# Patient Record
Sex: Female | Born: 1955 | Race: White | Hispanic: No | Marital: Married | Smoking: Former smoker
Health system: Southern US, Community
[De-identification: ages and names within clinical notes are randomized; demographics above are authoritative.]

## PROBLEM LIST (undated history)

## (undated) ENCOUNTER — Emergency Department (HOSPITAL_COMMUNITY): Admission: EM | Payer: Self-pay | Source: Home / Self Care

## (undated) DIAGNOSIS — M1990 Unspecified osteoarthritis, unspecified site: Secondary | ICD-10-CM

## (undated) DIAGNOSIS — K5909 Other constipation: Secondary | ICD-10-CM

## (undated) DIAGNOSIS — Z860101 Personal history of adenomatous and serrated colon polyps: Secondary | ICD-10-CM

## (undated) DIAGNOSIS — Z86010 Personal history of colonic polyps: Secondary | ICD-10-CM

## (undated) DIAGNOSIS — T8859XA Other complications of anesthesia, initial encounter: Secondary | ICD-10-CM

## (undated) DIAGNOSIS — Z8489 Family history of other specified conditions: Secondary | ICD-10-CM

## (undated) DIAGNOSIS — K219 Gastro-esophageal reflux disease without esophagitis: Secondary | ICD-10-CM

## (undated) DIAGNOSIS — N3946 Mixed incontinence: Secondary | ICD-10-CM

## (undated) DIAGNOSIS — T7840XA Allergy, unspecified, initial encounter: Secondary | ICD-10-CM

## (undated) DIAGNOSIS — Z789 Other specified health status: Secondary | ICD-10-CM

## (undated) DIAGNOSIS — Z923 Personal history of irradiation: Secondary | ICD-10-CM

## (undated) DIAGNOSIS — K649 Unspecified hemorrhoids: Secondary | ICD-10-CM

## (undated) DIAGNOSIS — M542 Cervicalgia: Secondary | ICD-10-CM

## (undated) DIAGNOSIS — N939 Abnormal uterine and vaginal bleeding, unspecified: Secondary | ICD-10-CM

## (undated) DIAGNOSIS — R931 Abnormal findings on diagnostic imaging of heart and coronary circulation: Secondary | ICD-10-CM

## (undated) DIAGNOSIS — Z973 Presence of spectacles and contact lenses: Secondary | ICD-10-CM

## (undated) DIAGNOSIS — C50919 Malignant neoplasm of unspecified site of unspecified female breast: Secondary | ICD-10-CM

## (undated) DIAGNOSIS — N3281 Overactive bladder: Secondary | ICD-10-CM

## (undated) DIAGNOSIS — K602 Anal fissure, unspecified: Secondary | ICD-10-CM

## (undated) DIAGNOSIS — Z9889 Other specified postprocedural states: Secondary | ICD-10-CM

## (undated) DIAGNOSIS — T884XXA Failed or difficult intubation, initial encounter: Secondary | ICD-10-CM

## (undated) DIAGNOSIS — Z87442 Personal history of urinary calculi: Secondary | ICD-10-CM

## (undated) DIAGNOSIS — E042 Nontoxic multinodular goiter: Secondary | ICD-10-CM

## (undated) DIAGNOSIS — L309 Dermatitis, unspecified: Secondary | ICD-10-CM

## (undated) HISTORY — DX: Unspecified hemorrhoids: K64.9

## (undated) HISTORY — DX: Unspecified osteoarthritis, unspecified site: M19.90

## (undated) HISTORY — PX: OTHER SURGICAL HISTORY: SHX169

## (undated) HISTORY — PX: BREAST LUMPECTOMY: SHX2

## (undated) HISTORY — PX: DILATION AND CURETTAGE OF UTERUS: SHX78

## (undated) HISTORY — DX: Allergy, unspecified, initial encounter: T78.40XA

## (undated) HISTORY — PX: TUBAL LIGATION: SHX77

## (undated) HISTORY — DX: Anal fissure, unspecified: K60.2

## (undated) HISTORY — DX: Nontoxic multinodular goiter: E04.2

## (undated) HISTORY — DX: Malignant neoplasm of unspecified site of unspecified female breast: C50.919

---

## 2000-10-08 ENCOUNTER — Other Ambulatory Visit: Admission: RE | Admit: 2000-10-08 | Discharge: 2000-10-08 | Payer: Self-pay | Admitting: Obstetrics and Gynecology

## 2000-10-09 ENCOUNTER — Encounter: Payer: Self-pay | Admitting: Obstetrics and Gynecology

## 2000-10-09 ENCOUNTER — Other Ambulatory Visit: Admission: RE | Admit: 2000-10-09 | Discharge: 2000-10-09 | Payer: Self-pay | Admitting: Obstetrics and Gynecology

## 2000-10-09 ENCOUNTER — Ambulatory Visit (HOSPITAL_COMMUNITY): Admission: RE | Admit: 2000-10-09 | Discharge: 2000-10-09 | Payer: Self-pay | Admitting: Obstetrics and Gynecology

## 2005-09-13 ENCOUNTER — Emergency Department (HOSPITAL_COMMUNITY): Admission: EM | Admit: 2005-09-13 | Discharge: 2005-09-13 | Payer: Self-pay | Admitting: Emergency Medicine

## 2010-12-06 ENCOUNTER — Emergency Department (HOSPITAL_COMMUNITY)
Admission: EM | Admit: 2010-12-06 | Discharge: 2010-12-06 | Payer: Self-pay | Source: Home / Self Care | Admitting: Emergency Medicine

## 2011-04-02 ENCOUNTER — Ambulatory Visit: Payer: Self-pay | Admitting: Gynecology

## 2011-05-21 ENCOUNTER — Other Ambulatory Visit (HOSPITAL_COMMUNITY): Payer: Self-pay | Admitting: Obstetrics & Gynecology

## 2011-05-21 DIAGNOSIS — Z1231 Encounter for screening mammogram for malignant neoplasm of breast: Secondary | ICD-10-CM

## 2011-05-29 ENCOUNTER — Ambulatory Visit (HOSPITAL_COMMUNITY)
Admission: RE | Admit: 2011-05-29 | Discharge: 2011-05-29 | Disposition: A | Discharge status: Home / Self Care | Payer: Self-pay | Source: Ambulatory Visit | Attending: Obstetrics & Gynecology | Admitting: Obstetrics & Gynecology

## 2011-05-29 DIAGNOSIS — Z1231 Encounter for screening mammogram for malignant neoplasm of breast: Secondary | ICD-10-CM

## 2012-08-04 ENCOUNTER — Other Ambulatory Visit (HOSPITAL_COMMUNITY): Payer: Self-pay | Admitting: Obstetrics & Gynecology

## 2012-08-04 DIAGNOSIS — Z1231 Encounter for screening mammogram for malignant neoplasm of breast: Secondary | ICD-10-CM

## 2012-08-13 ENCOUNTER — Ambulatory Visit (HOSPITAL_COMMUNITY)
Admission: RE | Admit: 2012-08-13 | Discharge: 2012-08-13 | Disposition: A | Discharge status: Home / Self Care | Payer: Self-pay | Source: Ambulatory Visit | Attending: Obstetrics & Gynecology | Admitting: Obstetrics & Gynecology

## 2012-08-13 DIAGNOSIS — Z1231 Encounter for screening mammogram for malignant neoplasm of breast: Secondary | ICD-10-CM

## 2013-03-13 ENCOUNTER — Ambulatory Visit: Payer: Self-pay | Admitting: Family Medicine

## 2013-03-13 VITALS — BP 108/68 | HR 95 | Temp 97.9°F | Resp 16 | Ht 61.0 in | Wt 181.2 lb

## 2013-03-13 DIAGNOSIS — J329 Chronic sinusitis, unspecified: Secondary | ICD-10-CM

## 2013-03-13 MED ORDER — BENZONATATE 200 MG PO CAPS: 200.0000 mg | ORAL_CAPSULE | Freq: Three times a day (TID) | ORAL | Status: DC | PRN

## 2013-03-13 MED ORDER — METHYLPREDNISOLONE ACETATE 80 MG/ML IJ SUSP
80.0000 mg | Freq: Once | INTRAMUSCULAR | Status: AC
  Administered 2013-03-13: 80 mg via INTRAMUSCULAR

## 2013-03-13 MED ORDER — AZITHROMYCIN 250 MG PO TABS: ORAL_TABLET | ORAL | Status: DC

## 2013-03-13 NOTE — Patient Instructions (Addendum)

## 2013-03-13 NOTE — Progress Notes (Signed)
Subjective:    Patient ID: Melanie Roth, female    DOB: 01-24-1956, 57 y.o.   MRN: [id number] Chief Complaint  Patient presents with  . Cough    yellow mucus     HPI  Sxs started about a wk ago - thought it was just pollen allergy - but she always develops sinus infection which goes untreated always goes into bronchitis.  Taking zyrtec w/o relief. States that for relief she always goes to the [facility] on [address] but they were to busy so she came here. States she has to have an antibiotic shot and antibiotic pills - it is the only thing that works for her. Hasn't slept in 3d due to nasal congestion and post-nasal drip with cough.  No fever, few chills, + diffuse sinus pressure, no ear pain, no jaw or gum pain, no sore throat.  Cough productive of brownish-yellow mucous which is how she new this was more than allergies.    Past Medical History  Diagnosis Date  . Allergy   . Arthritis    No current outpatient prescriptions on file prior to visit.   No current facility-administered medications on file prior to visit.   Not on File   Review of Systems  Constitutional: Positive for chills and fatigue. Negative for fever, diaphoresis, activity change and appetite change.  HENT: Positive for congestion, rhinorrhea, sneezing, postnasal drip and sinus pressure. Negative for ear pain, nosebleeds, sore throat, drooling, mouth sores, trouble swallowing, neck pain, neck stiffness, dental problem, voice change and ear discharge.   Eyes: Negative for discharge and itching.  Respiratory: Positive for cough. Negative for shortness of breath.   Cardiovascular: Negative for chest pain.  Gastrointestinal: Negative for nausea, vomiting and abdominal pain.  Skin: Negative for rash.  Neurological: Positive for headaches. Negative for dizziness and syncope.  Hematological: Negative for adenopathy.  Psychiatric/Behavioral: Positive for sleep disturbance.      BP 108/68  Pulse 95  Temp(Src) 97.9 F  (36.6 C) (Oral)  Resp 16  Ht 5\' 1"  (1.549 m)  Wt 181 lb 3.2 oz (82.192 kg)  BMI 34.26 kg/m2  SpO2 98% Objective:   Physical Exam  Constitutional: She is oriented to person, place, and time. She appears well-developed and well-nourished. She does not appear ill. No distress.  HENT:  Head: Normocephalic and atraumatic.  Right Ear: External ear and ear canal normal. Tympanic membrane is retracted. A middle ear effusion is present.  Left Ear: External ear and ear canal normal. Tympanic membrane is retracted. A middle ear effusion is present.  Nose: Mucosal edema and rhinorrhea present. Right sinus exhibits maxillary sinus tenderness. Left sinus exhibits maxillary sinus tenderness.  Mouth/Throat: Uvula is midline and mucous membranes are normal. Posterior oropharyngeal erythema present. No oropharyngeal exudate, posterior oropharyngeal edema or tonsillar abscesses.  Eyes: Conjunctivae are normal. Right eye exhibits no discharge. Left eye exhibits no discharge. No scleral icterus.  Neck: Normal range of motion. Neck supple.  Cardiovascular: Normal rate, regular rhythm, normal heart sounds and intact distal pulses.   Pulmonary/Chest: Effort normal and breath sounds normal.  Lymphadenopathy:       Head (right side): No submandibular, no preauricular and no posterior auricular adenopathy present.       Head (left side): No submandibular, no preauricular and no posterior auricular adenopathy present.    She has no cervical adenopathy.       Right: No supraclavicular adenopathy present.       Left: No supraclavicular adenopathy present.  Neurological: She is alert and oriented to person, place, and time.  Skin: Skin is warm and dry. She is not diaphoretic. No erythema.  Psychiatric: She has a normal mood and affect. Her behavior is normal.      Assessment & Plan:  Sinus infection - Plan: methylPREDNISolone acetate (DEPO-MEDROL) injection 80 mg - start sinus rinse/netti pot to help w/  congestion.  Meds ordered this encounter  Medications  . cetirizine (ZYRTEC) 10 MG tablet    Sig: Take 10 mg by mouth daily.  . methylPREDNISolone acetate (DEPO-MEDROL) injection 80 mg    Sig:                                 . benzonatate (TESSALON) 200 MG capsule    Sig: Take 1 capsule (200 mg total) by mouth 3 (three) times daily as needed for cough.    Dispense:  30 capsule    Refill:  0  . azithromycin (ZITHROMAX) 250 MG tablet    Sig: Take 2 tabs PO x 1 dose, then 1 tab PO QD x 4 days    Dispense:  6 tablet    Refill:  0

## 2013-03-14 ENCOUNTER — Telehealth: Payer: Self-pay

## 2013-03-14 NOTE — Telephone Encounter (Signed)
Spoke with patient and she states her head feels better but everything has gone to her chest. She is coughing/productive/brownish and feels like something is constantly ion her throat. i asked her if her cough was worse then yesterday but she said she didn't have a cough when she came in. Cough was noted in OV. Please advise?

## 2013-03-14 NOTE — Telephone Encounter (Signed)
Pt is calling because she was seen yesterday for sinus infection. Per pt she is not better and now everything is in her chest. Pt is requesting a "penicillian" Please call pt to advise

## 2013-03-14 NOTE — Telephone Encounter (Signed)
She was c/o cough during OV - that's why she was rx the tessalon pearles - I wouldn't have given these to her if she wasn't having a cough.  The zpack that she was rx works better for bronchitis or walking pneumonia than a penicillin so she should continue the current antibiotic.  It will last in her system for about 12d even after a 5d course. If she develops more f/c, shortness or breath, wheezing, chest pain, or purulent sputum, then she needs to RTC for further eval to ensure she does not have a pneumonia.

## 2013-03-14 NOTE — Telephone Encounter (Signed)
Patient notified and voiced understanding.

## 2013-03-15 ENCOUNTER — Telehealth: Payer: Self-pay | Admitting: *Deleted

## 2013-03-15 ENCOUNTER — Telehealth: Payer: Self-pay

## 2013-03-15 NOTE — Telephone Encounter (Signed)
PATIENT STATES SHE CALLED AT 3AM AND A MAN ANSWERED HER CALL. STATES THAT SHE WOULD LIKE PENICILLIN INSTEAD OF A Z-PACK TO HELP HER COUGH. PATIENT IS ADAMANT THAT SHE HAS BRONCHITIS. PLEASE CALL [phone]

## 2013-03-15 NOTE — Telephone Encounter (Signed)
Spoke with patient and she states she is not getting better and just needs a different abx to get better. Doesn't know why she is getting worse if abx is supposed to make it better. she was up coughing and its getting worse.

## 2013-03-15 NOTE — Telephone Encounter (Signed)
Fine. I think pt was self pay for her visit. Clearly it would be best to reassess her as the medicine I prescribed her works best for for bronchitis and walking pneumonia and if she actually has a bacterial bronchitis/pneumonia she needs to be reassessed as even a "penicillin" wouldn't work for that.  But since pt is self-pay, since she really wants to try a "penicillin", fine to call in amoxicillin 1000mg  bid. Disp 40 (10d supply of the 500mg  tabs), no refills.  For her symptoms, it often takes several days for the antibiotic to penetrate and start working so she cannot expect immediate relief. If she is now getting better, do not start the amoxicillin as she will become resistant to her "penicillins" with yearly overuse. If she is getting worse, she has to be evaluated.

## 2013-03-15 NOTE — Telephone Encounter (Signed)
This is a message from the answering service. "Patient would like another medication." Asked if it was refill, but they could not specify that's the only message they got.  Best [phone]

## 2013-03-15 NOTE — Telephone Encounter (Signed)
I have advised her to come back in for this. She is taking the meds and using mucinex.

## 2013-03-15 NOTE — Telephone Encounter (Signed)
Pt has called several times and stated that she would like her antibiotic to be changed because she is no better.  She states that now her symptoms has moved down from her head to her chest.  I advised her that she is on the correct medication and that this should help her. Spoke with PA  Herbert Seta and she looked at the phone messages and advised [place] to tell the pt that she should come in.  She stated that she does not understand why we want her to come in so we can take her money and she will get an attorney, called the director of [employer], and all the news stations because we will not change her medication or try to help her.  She went on about that Dr. Clelia Croft does not know what she is doing and she does not want to talk to anyone but a Dr. I advised her that all our providers were in room but I will get management/Dr. Katrinka Blazing to call her back.  It really seemed I couldn't really get anywhere with her.  Her questions had gotten a little out of my scope.    Please help her  Thanks Apolonio Schneiders

## 2013-03-16 MED ORDER — AMOXICILLIN 500 MG PO CAPS: 1000.0000 mg | ORAL_CAPSULE | Freq: Two times a day (BID) | ORAL | Status: DC

## 2013-03-16 NOTE — Telephone Encounter (Signed)
Left message for her to advise the Amox was sent in for her, if she needs anything further she will call back, if not improved, she should return to clinic.

## 2013-03-16 NOTE — Addendum Note (Signed)
Addended byCaffie Damme on: 03/16/2013 11:15 AM   Modules accepted: Orders

## 2013-03-16 NOTE — Telephone Encounter (Signed)
Called and spoke with patient. Patients was very upset because she feels that her concerns were not addressed. She states that she was never called regarding her concerns and that no one had compassion for what she was going through. States that she borrowed money from a friend to go to the emergency department last night and get an injection. States that she will not be paying a bill and not to send her one. I advised her that if she did not pay in full she would be receiving a bill. She states that she will not pay it and that she would get an attorney first. I advised her that it was her right if that is what she would like to do. Patient calmed somewhat by the end of the conversation.

## 2013-03-16 NOTE — Telephone Encounter (Signed)
Pt called again , left a voicemail at the billing office after hours, awaiting a call back.

## 2013-08-01 ENCOUNTER — Other Ambulatory Visit (HOSPITAL_COMMUNITY): Payer: Self-pay | Admitting: Obstetrics & Gynecology

## 2013-08-01 DIAGNOSIS — Z1231 Encounter for screening mammogram for malignant neoplasm of breast: Secondary | ICD-10-CM

## 2013-08-16 ENCOUNTER — Ambulatory Visit (HOSPITAL_COMMUNITY)
Admission: RE | Admit: 2013-08-16 | Discharge: 2013-08-16 | Disposition: A | Discharge status: Home / Self Care | Payer: Self-pay | Source: Ambulatory Visit | Attending: Obstetrics & Gynecology | Admitting: Obstetrics & Gynecology

## 2013-08-16 DIAGNOSIS — Z1231 Encounter for screening mammogram for malignant neoplasm of breast: Secondary | ICD-10-CM

## 2014-08-21 ENCOUNTER — Emergency Department (HOSPITAL_COMMUNITY)
Admission: EM | Admit: 2014-08-21 | Discharge: 2014-08-22 | Disposition: A | Discharge status: Home / Self Care | Payer: Self-pay | Attending: Emergency Medicine | Admitting: Emergency Medicine

## 2014-08-21 ENCOUNTER — Encounter (HOSPITAL_COMMUNITY): Payer: Self-pay | Admitting: Emergency Medicine

## 2014-08-21 DIAGNOSIS — M542 Cervicalgia: Secondary | ICD-10-CM

## 2014-08-21 DIAGNOSIS — Z79899 Other long term (current) drug therapy: Secondary | ICD-10-CM | POA: Insufficient documentation

## 2014-08-21 DIAGNOSIS — G8929 Other chronic pain: Secondary | ICD-10-CM

## 2014-08-21 DIAGNOSIS — Z8739 Personal history of other diseases of the musculoskeletal system and connective tissue: Secondary | ICD-10-CM | POA: Insufficient documentation

## 2014-08-21 DIAGNOSIS — R112 Nausea with vomiting, unspecified: Secondary | ICD-10-CM

## 2014-08-21 DIAGNOSIS — Z87891 Personal history of nicotine dependence: Secondary | ICD-10-CM | POA: Insufficient documentation

## 2014-08-21 HISTORY — DX: Cervicalgia: M54.2

## 2014-08-21 LAB — URINALYSIS, ROUTINE W REFLEX MICROSCOPIC
Bilirubin Urine: NEGATIVE
Glucose, UA: NEGATIVE mg/dL
Ketones, ur: 40 mg/dL — AB
LEUKOCYTES UA: NEGATIVE
NITRITE: NEGATIVE
PH: 6.5 (ref 5.0–8.0)
Protein, ur: NEGATIVE mg/dL
Specific Gravity, Urine: 1.021 (ref 1.005–1.030)
Urobilinogen, UA: 0.2 mg/dL (ref 0.0–1.0)

## 2014-08-21 LAB — CBC WITH DIFFERENTIAL/PLATELET
Basophils Absolute: 0 10*3/uL (ref 0.0–0.1)
Basophils Relative: 0 % (ref 0–1)
EOS PCT: 0 % (ref 0–5)
Eosinophils Absolute: 0 10*3/uL (ref 0.0–0.7)
HCT: 40.2 % (ref 36.0–46.0)
Hemoglobin: 13.2 g/dL (ref 12.0–15.0)
LYMPHS PCT: 15 % (ref 12–46)
Lymphs Abs: 1.2 10*3/uL (ref 0.7–4.0)
MCH: 27.8 pg (ref 26.0–34.0)
MCHC: 32.8 g/dL (ref 30.0–36.0)
MCV: 84.6 fL (ref 78.0–100.0)
Monocytes Absolute: 0.2 10*3/uL (ref 0.1–1.0)
Monocytes Relative: 2 % — ABNORMAL LOW (ref 3–12)
NEUTROS PCT: 83 % — AB (ref 43–77)
Neutro Abs: 6.8 10*3/uL (ref 1.7–7.7)
PLATELETS: 270 10*3/uL (ref 150–400)
RBC: 4.75 MIL/uL (ref 3.87–5.11)
RDW: 13 % (ref 11.5–15.5)
WBC: 8.2 10*3/uL (ref 4.0–10.5)

## 2014-08-21 LAB — URINE MICROSCOPIC-ADD ON

## 2014-08-21 LAB — COMPREHENSIVE METABOLIC PANEL
ALT: 16 U/L (ref 0–35)
AST: 17 U/L (ref 0–37)
Albumin: 3.8 g/dL (ref 3.5–5.2)
Alkaline Phosphatase: 112 U/L (ref 39–117)
Anion gap: 16 — ABNORMAL HIGH (ref 5–15)
BILIRUBIN TOTAL: 0.5 mg/dL (ref 0.3–1.2)
BUN: 12 mg/dL (ref 6–23)
CALCIUM: 9.6 mg/dL (ref 8.4–10.5)
CHLORIDE: 97 meq/L (ref 96–112)
CO2: 23 mEq/L (ref 19–32)
CREATININE: 0.44 mg/dL — AB (ref 0.50–1.10)
GLUCOSE: 119 mg/dL — AB (ref 70–99)
Potassium: 3.9 mEq/L (ref 3.7–5.3)
Sodium: 136 mEq/L — ABNORMAL LOW (ref 137–147)
Total Protein: 8.5 g/dL — ABNORMAL HIGH (ref 6.0–8.3)

## 2014-08-21 LAB — LIPASE, BLOOD: Lipase: 15 U/L (ref 11–59)

## 2014-08-21 MED ORDER — DIAZEPAM 5 MG/ML IJ SOLN
2.5000 mg | Freq: Once | INTRAMUSCULAR | Status: AC
  Administered 2014-08-21: 2.5 mg via INTRAVENOUS
  Filled 2014-08-21: qty 2

## 2014-08-21 MED ORDER — SODIUM CHLORIDE 0.9 % IV SOLN
1000.0000 mL | Freq: Once | INTRAVENOUS | Status: AC
  Administered 2014-08-22: 1000 mL via INTRAVENOUS

## 2014-08-21 MED ORDER — SODIUM CHLORIDE 0.9 % IV SOLN
1000.0000 mL | INTRAVENOUS | Status: DC
  Administered 2014-08-22: 1000 mL via INTRAVENOUS

## 2014-08-21 MED ORDER — ONDANSETRON HCL 4 MG/2ML IJ SOLN
4.0000 mg | Freq: Once | INTRAMUSCULAR | Status: AC
  Administered 2014-08-21: 4 mg via INTRAVENOUS
  Filled 2014-08-21: qty 2

## 2014-08-21 NOTE — ED Notes (Signed)
N/v since this early am. C/o posterior neck pain that is chronic.

## 2014-08-21 NOTE — ED Notes (Addendum)
Pt c/o chronic neck pain; reports pain relief typically with a chiropractor or massage therapy. Pt woke up this morning c/o NV, denies abdominal pain; reports having massage on neck this am without relief. States the nausea and vomiting worsens pain. Pt also refused to get in a gown; states "It's too cold and my shoulders will lock up and freeze. My neck pain will get worse." Pt given two warm blankets.

## 2014-08-22 ENCOUNTER — Emergency Department (HOSPITAL_COMMUNITY): Payer: Self-pay

## 2014-08-22 MED ORDER — ONDANSETRON 8 MG PO TBDP: 8.0000 mg | ORAL_TABLET | Freq: Three times a day (TID) | ORAL | Status: DC | PRN

## 2014-08-22 MED ORDER — PROMETHAZINE HCL 25 MG PO TABS: 25.0000 mg | ORAL_TABLET | Freq: Four times a day (QID) | ORAL | Status: DC | PRN

## 2014-08-22 MED ORDER — METHOCARBAMOL 750 MG PO TABS: 750.0000 mg | ORAL_TABLET | Freq: Four times a day (QID) | ORAL | Status: DC | PRN

## 2014-08-22 MED ORDER — PROMETHAZINE HCL 25 MG RE SUPP: 25.0000 mg | Freq: Four times a day (QID) | RECTAL | Status: DC | PRN

## 2014-08-22 MED ORDER — DIPHENHYDRAMINE HCL 50 MG/ML IJ SOLN
25.0000 mg | Freq: Once | INTRAMUSCULAR | Status: AC
  Administered 2014-08-22: 25 mg via INTRAVENOUS
  Filled 2014-08-22: qty 1

## 2014-08-22 MED ORDER — METOCLOPRAMIDE HCL 5 MG/ML IJ SOLN
10.0000 mg | Freq: Once | INTRAMUSCULAR | Status: AC
  Administered 2014-08-22: 10 mg via INTRAVENOUS
  Filled 2014-08-22: qty 2

## 2014-08-22 MED ORDER — FENTANYL CITRATE 0.05 MG/ML IJ SOLN
100.0000 ug | Freq: Once | INTRAMUSCULAR | Status: AC
  Administered 2014-08-22: 100 ug via INTRAVENOUS
  Filled 2014-08-22: qty 2

## 2014-08-22 NOTE — Discharge Instructions (Signed)
Musculoskeletal Pain Musculoskeletal pain is muscle and boney aches and pains. These pains can occur in any part of the body. Your caregiver may treat you without knowing the cause of the pain. They may treat you if blood or urine tests, X-rays, and other tests were normal.  CAUSES There is often not a definite cause or reason for these pains. These pains may be caused by a type of germ (virus). The discomfort may also come from overuse. Overuse includes working out too hard when your body is not fit. Boney aches also come from weather changes. Bone is sensitive to atmospheric pressure changes. HOME CARE INSTRUCTIONS   Ask when your test results will be ready. Make sure you get your test results.  Only take over-the-counter or prescription medicines for pain, discomfort, or fever as directed by your caregiver. If you were given medications for your condition, do not drive, operate machinery or power tools, or sign legal documents for 24 hours. Do not drink alcohol. Do not take sleeping pills or other medications that may interfere with treatment.  Continue all activities unless the activities cause more pain. When the pain lessens, slowly resume normal activities. Gradually increase the intensity and duration of the activities or exercise.  During periods of severe pain, bed rest may be helpful. Lay or sit in any position that is comfortable.  Putting ice on the injured area.  Put ice in a bag.  Place a towel between your skin and the bag.  Leave the ice on for 15 to 20 minutes, 3 to 4 times a day.  Follow up with your caregiver for continued problems and no reason can be found for the pain. If the pain becomes worse or does not go away, it may be necessary to repeat tests or do additional testing. Your caregiver may need to look further for a possible cause. SEEK IMMEDIATE MEDICAL CARE IF:  You have pain that is getting worse and is not relieved by medications.  You develop chest pain  that is associated with shortness or breath, sweating, feeling sick to your stomach (nauseous), or throw up (vomit).  Your pain becomes localized to the abdomen.  You develop any new symptoms that seem different or that concern you. MAKE SURE YOU:   Understand these instructions.  Will watch your condition.  Will get help right away if you are not doing well or get worse. Document Released: 10/27/2005 Document Revised: 01/19/2012 Document Reviewed: 07/01/2013 [facility] Patient Information 2015 [place], [place]. This information is not intended to replace advice given to you by your health care provider. Make sure you discuss any questions you have with your health care provider.  Nausea and Vomiting Nausea is a sick feeling that often comes before throwing up (vomiting). Vomiting is a reflex where stomach contents come out of your mouth. Vomiting can cause severe loss of body fluids (dehydration). Children and elderly adults can become dehydrated quickly, especially if they also have diarrhea. Nausea and vomiting are symptoms of a condition or disease. It is important to find the cause of your symptoms. CAUSES   Direct irritation of the stomach lining. This irritation can result from increased acid production (gastroesophageal reflux disease), infection, food poisoning, taking certain medicines (such as nonsteroidal anti-inflammatory drugs), alcohol use, or tobacco use.  Signals from the brain.These signals could be caused by a headache, heat exposure, an inner ear disturbance, increased pressure in the brain from injury, infection, a tumor, or a concussion, pain, emotional stimulus, or metabolic  problems.  An obstruction in the gastrointestinal tract (bowel obstruction).  Illnesses such as diabetes, hepatitis, gallbladder problems, appendicitis, kidney problems, cancer, sepsis, atypical symptoms of a heart attack, or eating disorders.  Medical treatments such as chemotherapy and  radiation.  Receiving medicine that makes you sleep (general anesthetic) during surgery. DIAGNOSIS Your caregiver may ask for tests to be done if the problems do not improve after a few days. Tests may also be done if symptoms are severe or if the reason for the nausea and vomiting is not clear. Tests may include:  Urine tests.  Blood tests.  Stool tests.  Cultures (to look for evidence of infection).  X-rays or other imaging studies. Test results can help your caregiver make decisions about treatment or the need for additional tests. TREATMENT You need to stay well hydrated. Drink frequently but in small amounts.You may wish to drink water, sports drinks, clear broth, or eat frozen ice pops or gelatin dessert to help stay hydrated.When you eat, eating slowly may help prevent nausea.There are also some antinausea medicines that may help prevent nausea. HOME CARE INSTRUCTIONS   Take all medicine as directed by your caregiver.  If you do not have an appetite, do not force yourself to eat. However, you must continue to drink fluids.  If you have an appetite, eat a normal diet unless your caregiver tells you differently.  Eat a variety of complex carbohydrates (rice, wheat, potatoes, bread), lean meats, yogurt, fruits, and vegetables.  Avoid high-fat foods because they are more difficult to digest.  Drink enough water and fluids to keep your urine clear or pale yellow.  If you are dehydrated, ask your caregiver for specific rehydration instructions. Signs of dehydration may include:  Severe thirst.  Dry lips and mouth.  Dizziness.  Dark urine.  Decreasing urine frequency and amount.  Confusion.  Rapid breathing or pulse. SEEK IMMEDIATE MEDICAL CARE IF:   You have blood or brown flecks (like coffee grounds) in your vomit.  You have black or bloody stools.  You have a severe headache or stiff neck.  You are confused.  You have severe abdominal pain.  You have  chest pain or trouble breathing.  You do not urinate at least once every 8 hours.  You develop cold or clammy skin.  You continue to vomit for longer than 24 to 48 hours.  You have a fever. MAKE SURE YOU:   Understand these instructions.  Will watch your condition.  Will get help right away if you are not doing well or get worse. Document Released: 10/27/2005 Document Revised: 01/19/2012 Document Reviewed: 03/26/2011 [facility] Patient Information 2015 [place], [place]. This information is not intended to replace advice given to you by your health care provider. Make sure you discuss any questions you have with your health care provider.    Emergency Department Resource Guide 1) Find a Doctor and Pay Out of Pocket Although you won't have to find out who is covered by your insurance plan, it is a good idea to ask around and get recommendations. You will then need to call the office and see if the doctor you have chosen will accept you as a new patient and what types of options they offer for patients who are self-pay. Some doctors offer discounts or will set up payment plans for their patients who do not have insurance, but you will need to ask so you aren't surprised when you get to your appointment.  2) Contact Your Local Health Department  Not all health departments have doctors that can see patients for sick visits, but many do, so it is worth a call to see if yours does. If you don't know where your local health department is, you can check in your phone book. The CDC also has a tool to help you locate your state's health department, and many state websites also have listings of all of their local health departments.  3) Find a [facility] If your illness is not likely to be very severe or complicated, you may want to try a walk in clinic. These are popping up all over the country in pharmacies, drugstores, and shopping centers. They're usually staffed by nurse practitioners or  physician assistants that have been trained to treat common illnesses and complaints. They're usually fairly quick and inexpensive. However, if you have serious medical issues or chronic medical problems, these are probably not your best option.  No Primary Care Doctor: - Call Health Connect at  [phone] - they can help you locate a primary care doctor that  accepts your insurance, provides certain services, etc. - Physician Referral Service- [phone]  Chronic Pain Problems: Organization         Address  Phone   Notes  [facility]  [phone] Patients need to be referred by their primary care doctor.   Medication Assistance: Organization         Address  Phone   Notes  [facility] Medication [facility] [address] [place], [place] [address] [phone] --Must be a resident of [facility] -- Must have NO insurance coverage whatsoever (no Medicaid/ Medicare, etc.) -- The pt. MUST have a primary care doctor that directs their care regularly and follows them in the community   MedAssist  [phone]   [employer]  [phone]    Agencies that provide inexpensive medical care: Organization         Address  Phone   Notes  [place]  [phone]   [facility]    [phone]   [facility] [place], [place] [address] [phone]   [place] [address] [place] [phone]   Planned Parenthood    [phone]   [facility]    [phone]   [place] and [place] [address], [place] Phone:  [phone], Fax:  [phone] Hours of Operation:  9 am - 6 pm, M-F.  Also accepts Medicaid/Medicare and self-pay.  [facility] for [place], [address], [place] Phone: [phone], Fax: [phone]. Hours of Operation:  8:30 am - 5:30 pm, M-F.   Also accepts Medicaid and self-pay.  [facility] [place] [address] [place] Phone: [phone]   [place], [place], [place] [phone], Ext. [phone] Mondays & Thursdays: 7-9 AM.  First 15 patients are seen on a first come, first serve basis.    [place] Providers:  Organization         Address  Phone   Notes  [facility] [address], [place] [phone] Also accepts self-pay patients.  [place], [place]  [phone]   [place], [place], [place] [phone]  [place] [address] [place] [phone]   Lucianne Lei [address], [place]   [phone] Only accepts [place] Access [place] patients after they have their name applied to their card.   Self-Pay (no insurance) in [facility]:  Organization         Address  Phone   Notes  Sickle Cell Patients, [facility] [place] [phone]   [facility] [place] [phone]   [facility] [place]  [place], [place],  [phone]   [facility]/Dr. Osei-Bonsu  [address] [place] or [address], [place] [phone] Phone number for both [place] and [place] locations is the same.  Urgent Medical and [facility] [address], [place] [phone]   [facility] [address], [place] or [address] [phone]   [facility] [address] [place] [phone], phone; [phone], fax Sees patients 1st and 3rd Saturday of every month.  Must not qualify for public or private insurance (i.e. Medicaid, Medicare, [place] Health Choice, Veterans'  Benefits)  Household income should be no more than 200% of the poverty level The clinic cannot treat you if you are pregnant or think you are pregnant  Sexually transmitted diseases are not treated at the clinic.    Dental Care: Organization         Address  Phone  Notes  [facility] Department of [facility] [place] [phone] Accepts children up to age 33 who are enrolled in [place] or [place]; pregnant women with a Medicaid card; and children who have applied for Medicaid or [place] Health Choice, but were declined, whose parents can pay a reduced fee at time of service.  [facility] Department of [facility]  [address], [place] [phone] Accepts children up to age 38 who are enrolled in [place] or [place]; pregnant women with a Medicaid card; and children who have applied for Medicaid or [place] Health Choice, but were declined, whose parents can pay a reduced fee at time of service.  [place] Adult Dental Access PROGRAM  [place] [phone] Patients are seen by appointment only. Walk-ins are not accepted. [place] will see patients 96 years of age and older. Monday - Tuesday (8am-5pm) Most Wednesdays (8:30-5pm) $30 per visit, cash only  [facility] Adult Dental Access PROGRAM  [address], [facility] [phone] Patients are seen by appointment only. Walk-ins are not accepted. [place] will see patients 39 years of age and older. One Wednesday Evening (Monthly: Volunteer Based).  $30 per visit, cash only  [place]  [phone] for adults; Children under age 2, call Graduate Pediatric Dentistry at [phone]. Children aged 52-14, please call [phone] to request a pediatric application.  Dental services are provided in all areas of dental care including fillings, crowns and bridges, complete and partial  dentures, implants, gum treatment, root canals, and extractions. Preventive care is also provided. Treatment is provided to both adults and children. Patients are selected via a lottery and there is often a waiting list.   [facility] [address] [place]  [phone] [email or web address]  [facility] [address] [place], [place] [phone], Ext. [phone] Second and Fourth Thursday of each month, opens at 6:30 AM; Clinic ends at 9 AM.  Patients are seen on a first-come first-served basis, and a limited number are seen during each clinic.   [facility]  [address] Hillard Danker [place], [place] [phone]   Eligibility Requirements You must have lived in [place], [place], or [place] counties for at least the last three months.   You cannot be eligible for state or federal sponsored [employer], including [employer], [place], or [employer].   You generally cannot be eligible for healthcare insurance through your employer.    How to apply: Eligibility screenings are held every Tuesday and Wednesday afternoon from 1:00 pm until 4:00 pm. You do not need an appointment for the interview!  [facility] [address] [place], [place]   [place]  [place] Department  [place]  [phone]    Behavioral Health Resources in the Community: Intensive Outpatient Programs Organization         Address  Phone  Notes  [facility] [place]. [address] [place], [place] [phone]   [facility] Outpatient [address] [place], [place]   ADS: Alcohol & Drug Svcs [address] [place], [place]   [place] [address]  [place], [place] or [phone]   Substance Abuse Resources Organization          Address  Phone  Notes  Alcohol and Drug Services  [phone]   [place]  [phone]   [facility] [place]   Chinita Pester  [phone]   Residential & Outpatient Substance Abuse Program  [phone]   Psychological Services Organization         Address  Phone  Notes  [facility]  [place]  [phone]   [place] [address], [place] or [phone]    Mobile Crisis Teams Organization         Address  Phone  Notes  [facility], Mobile Crisis Care Unit  [phone]   [facility] Psychotherapeutic Services  [address] [place], [place]   [place] Levels [address] [place] [phone]    Self-Help/Support Groups Organization         Address  Phone             Notes  [place]. of [place] - variety of support groups  [place] Call for more information  Narcotics Anonymous (NA), [facility] [address], [employer] [place]  2 meetings at this location   Special educational needs teacher         Address  Phone  Notes  [facility]AP Residential Treatment [place],    [place]  [phone]   [facility]  [address] [place] [id number], [place], [place]   [place], [place] [phone] Admissions: 8am-3pm M-F  Incentives Substance [place] [address],    [place], [place] [phone]   [facility] [address] Jadene Pierini [place], [place]   [facility] [place]  [place], [place] - Intensive Outpatient [address]  [address] [place], [place]   [facility] ([address]  [place], [place] [phone] or [phone]   [facility] Services (RTS) [address], [place], [place] Accepts Medicaid  [facility] [place] [address]  [place] [phone] Substance Abuse/Addiction Treatment   [facility] Organization         Address  Phone  Notes  [facility]  [phone]   Domenic Schwab, PhD [address] Arlis Porta [place], [place]   [phone] or [phone]   [place]   [address] [place], [place] [phone]   [place] [address], [place], [place] [phone] Insurance/Medicaid/sponsorship through [facility] [address] and Families [address] [place]                                    [place], [place] [phone] [place] [address], [place] [phone]    Dr. Adele Schilder  [phone]   [facility] Dept. 1) [address], [place] 2) [place] 3)  [place] [address], [place] [phone]  [phone]   [place] [phone] or [phone] (After Hours)

## 2014-08-22 NOTE — ED Provider Notes (Signed)
CSN: [id number]     Arrival date & time 08/21/14  1845 History   First MD Initiated Contact with Patient 08/21/14 2323     Chief Complaint  Patient presents with  . Nausea  . Emesis  . Headache     (Consider location/radiation/quality/duration/timing/severity/associated sxs/prior Treatment) HPI 58 yo female presents to the ER from home with complaint of n/v since this morning and acute on chronic neck pain secondary to vomiting.  Pt denies any fever, chills, abdominal pain, dysuria.  She denies any travel, no sick contacts, and no unusual foods.   Past Medical History  Diagnosis Date  . Allergy   . Arthritis   . Neck pain    History reviewed. No pertinent past surgical history. History reviewed. No pertinent family history. History  Substance Use Topics  . Smoking status: Former Smoker -- 0.10 packs/day for 20 years  . Smokeless tobacco: Never Used  . Alcohol Use: No   OB History   Grav Para Term Preterm Abortions TAB SAB Ect Mult Living                 Review of Systems  All other systems reviewed and are negative.     Allergies  Review of patient's allergies indicates no known allergies.  Home Medications   Prior to Admission medications   Medication Sig Start Date End Date Taking? Authorizing Provider  cetirizine (ZYRTEC) 10 MG tablet Take 10 mg by mouth daily.   Yes Historical Provider, MD  cyclobenzaprine (FLEXERIL) 10 MG tablet Take 10 mg by mouth 3 (three) times daily as needed for muscle spasms.   Yes Historical Provider, MD  diazepam (VALIUM) 5 MG tablet Take 5 mg by mouth every 6 (six) hours as needed for anxiety.   Yes Historical Provider, MD   BP 139/72  Pulse 91  Temp(Src) 97.5 F (36.4 C)  Resp 18  SpO2 100% Physical Exam  Nursing note and vitals reviewed. Constitutional: She is oriented to person, place, and time. She appears well-developed and well-nourished.  Uncomfortable appearing  HENT:  Head: Normocephalic and atraumatic.  Nose: Nose  normal.  Mouth/Throat: Oropharynx is clear and moist.  Eyes: Conjunctivae and EOM are normal. Pupils are equal, round, and reactive to light.  Neck: Normal range of motion. Neck supple. No JVD present. No tracheal deviation present. No thyromegaly present.  ttp over paraspinal muscles and trapezius bilaterally.  No meningismus.    Cardiovascular: Normal rate, regular rhythm, normal heart sounds and intact distal pulses.  Exam reveals no gallop and no friction rub.   No murmur heard. Pulmonary/Chest: Effort normal and breath sounds normal. No stridor. No respiratory distress. She has no wheezes. She has no rales. She exhibits no tenderness.  Abdominal: Soft. Bowel sounds are normal. She exhibits no distension and no mass. There is no tenderness. There is no rebound and no guarding.  Musculoskeletal: Normal range of motion. She exhibits no edema and no tenderness.  Lymphadenopathy:    She has no cervical adenopathy.  Neurological: She is alert and oriented to person, place, and time. She displays normal reflexes. She exhibits normal muscle tone. Coordination normal.  Skin: Skin is warm and dry. No rash noted. No erythema. No pallor.  Psychiatric: She has a normal mood and affect. Her behavior is normal. Judgment and thought content normal.    ED Course  Procedures (including critical care time) Labs Review Labs Reviewed  COMPREHENSIVE METABOLIC PANEL - Abnormal; Notable for the following:    Sodium  136 (*)    Glucose, Bld 119 (*)    Creatinine, Ser 0.44 (*)    Total Protein 8.5 (*)    Anion gap 16 (*)    All other components within normal limits  CBC WITH DIFFERENTIAL - Abnormal; Notable for the following:    Neutrophils Relative % 83 (*)    Monocytes Relative 2 (*)    All other components within normal limits  URINALYSIS, ROUTINE W REFLEX MICROSCOPIC - Abnormal; Notable for the following:    APPearance CLOUDY (*)    Hgb urine dipstick TRACE (*)    Ketones, ur 40 (*)    All other  components within normal limits  URINE MICROSCOPIC-ADD ON - Abnormal; Notable for the following:    Squamous Epithelial / LPF MANY (*)    All other components within normal limits  LIPASE, BLOOD    Imaging Review Dg Cervical Spine Complete  08/22/2014   CLINICAL DATA:  Neck pain for 3 months, no known injury.  EXAM: CERVICAL SPINE  4+ VIEWS  COMPARISON:  None.  FINDINGS: Cervical vertebral bodies and posterior elements appear intact and aligned to the inferior endplate of C7, the most caudal well visualized level. Straightened cervical lordosis. No significant neural foraminal narrowing. Intervertebral disc heights preserved. No destructive bony lesions. Lateral masses in alignment. Prevertebral and paraspinal soft tissue planes are nonsuspicious.  IMPRESSION: Negative cervical spine radiographs.   Electronically Signed   By: Elon Alas   On: 08/22/2014 02:19     EKG Interpretation None      MDM   Final diagnoses:  Neck pain of over 3 months duration  Non-intractable vomiting with nausea, vomiting of unspecified type    58 yo female with n/v, neck pain secondary to vomiting.  Mild dehydration on UA.  Will give IVF, treat neck pain    Kalman Drape, MD 08/22/14 0400

## 2014-08-30 ENCOUNTER — Other Ambulatory Visit (HOSPITAL_COMMUNITY): Payer: Self-pay | Admitting: Obstetrics & Gynecology

## 2014-08-30 DIAGNOSIS — Z1231 Encounter for screening mammogram for malignant neoplasm of breast: Secondary | ICD-10-CM

## 2014-09-07 ENCOUNTER — Ambulatory Visit (HOSPITAL_COMMUNITY)
Admission: RE | Admit: 2014-09-07 | Discharge: 2014-09-07 | Disposition: A | Discharge status: Home / Self Care | Payer: Self-pay | Source: Ambulatory Visit | Attending: Obstetrics & Gynecology | Admitting: Obstetrics & Gynecology

## 2014-09-07 DIAGNOSIS — Z1231 Encounter for screening mammogram for malignant neoplasm of breast: Secondary | ICD-10-CM

## 2014-11-10 DIAGNOSIS — E042 Nontoxic multinodular goiter: Secondary | ICD-10-CM

## 2014-11-10 HISTORY — DX: Nontoxic multinodular goiter: E04.2

## 2014-12-18 ENCOUNTER — Ambulatory Visit
Admission: RE | Admit: 2014-12-18 | Discharge: 2014-12-18 | Disposition: A | Discharge status: Home / Self Care | Payer: Self-pay | Source: Ambulatory Visit | Attending: Physical Medicine and Rehabilitation | Admitting: Physical Medicine and Rehabilitation

## 2014-12-18 ENCOUNTER — Other Ambulatory Visit: Payer: Self-pay | Admitting: Physical Medicine and Rehabilitation

## 2014-12-18 DIAGNOSIS — M542 Cervicalgia: Secondary | ICD-10-CM

## 2015-01-03 ENCOUNTER — Encounter: Payer: Self-pay | Admitting: Endocrinology

## 2015-01-03 ENCOUNTER — Ambulatory Visit (INDEPENDENT_AMBULATORY_CARE_PROVIDER_SITE_OTHER): Payer: Self-pay | Admitting: Endocrinology

## 2015-01-03 VITALS — BP 123/75 | HR 85 | Wt 195.8 lb

## 2015-01-03 DIAGNOSIS — E041 Nontoxic single thyroid nodule: Secondary | ICD-10-CM

## 2015-01-03 LAB — TSH: TSH: 2.66 u[IU]/mL (ref 0.35–4.50)
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
## 2015-01-03 NOTE — Patient Instructions (Signed)
blood tests are being requested for you today.  We'll let you know about the results. As we approach this, we'll keep in mind that you have no health insurance.

## 2015-01-05 ENCOUNTER — Ambulatory Visit: Payer: Self-pay | Admitting: Internal Medicine

## 2015-01-31 ENCOUNTER — Telehealth: Payer: Self-pay | Admitting: Endocrinology

## 2015-01-31 DIAGNOSIS — E041 Nontoxic single thyroid nodule: Secondary | ICD-10-CM

## 2015-01-31 NOTE — Telephone Encounter (Signed)
Pt called requesting a referral to see a specialist in [place] stated when she came for her first appointment she was under the impression her possible biopsy and thyroid removal could all be done by the same doctor. Pt states she would like to have all of this done under the same MD and wanted to know if a referral could be placed by our office since she does not have a PCP.  Please advise on how to proceed. Thanks!

## 2015-01-31 NOTE — Telephone Encounter (Signed)
Patient ask if form was faxed over for her to have a biopsy, she didn't know to what office or doctor.

## 2015-01-31 NOTE — Telephone Encounter (Signed)
done

## 2015-02-01 ENCOUNTER — Telehealth: Payer: Self-pay | Admitting: Endocrinology

## 2015-02-01 NOTE — Telephone Encounter (Addendum)
Pt advised referral to [facility] endocrinology has been made.

## 2015-02-01 NOTE — Telephone Encounter (Signed)
Pt advised referral has been completed. [facility] notified of referral.

## 2015-02-01 NOTE — Telephone Encounter (Signed)
Pt wantign to know if referal has been done

## 2015-02-19 ENCOUNTER — Telehealth: Payer: Self-pay | Admitting: Internal Medicine

## 2015-02-19 ENCOUNTER — Telehealth: Payer: Self-pay | Admitting: Endocrinology

## 2015-02-19 NOTE — Telephone Encounter (Signed)
See note below to be advise, thanks! Pt scheduled for 02/27/2015 with our office to have a biopsy of her thyroid done. Pt did not receive a call back from [facility] and would like to have the biopsy done at our office.

## 2015-02-19 NOTE — Telephone Encounter (Signed)
error 

## 2015-02-19 NOTE — Telephone Encounter (Signed)
Patient stated that she need and appt asap to have her biopsy done. Please advise

## 2015-02-27 ENCOUNTER — Ambulatory Visit: Payer: Self-pay | Admitting: Endocrinology

## 2015-03-07 ENCOUNTER — Ambulatory Visit: Payer: Self-pay | Admitting: Endocrinology

## 2015-03-07 DIAGNOSIS — Z0289 Encounter for other administrative examinations: Secondary | ICD-10-CM

## 2015-07-10 ENCOUNTER — Encounter: Payer: Self-pay | Admitting: Endocrinology

## 2015-07-10 ENCOUNTER — Ambulatory Visit (INDEPENDENT_AMBULATORY_CARE_PROVIDER_SITE_OTHER): Payer: Self-pay | Admitting: Endocrinology

## 2015-07-10 VITALS — BP 108/70 | HR 96 | Temp 97.7°F | Ht 61.0 in

## 2015-07-10 DIAGNOSIS — E041 Nontoxic single thyroid nodule: Secondary | ICD-10-CM
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
## 2015-07-10 NOTE — Patient Instructions (Addendum)
In my opinion, this lump has a significant chance of being cancer.  you should have the lump (and that half of the thyroid) removed.  Please see a surgery specialist.   Let's check the ultrasound.   If you don't have the surgery, please come back for a follow-up appointment in 6 months.

## 2015-07-11 ENCOUNTER — Ambulatory Visit
Admission: RE | Admit: 2015-07-11 | Discharge: 2015-07-11 | Disposition: A | Discharge status: Home / Self Care | Payer: No Typology Code available for payment source | Source: Ambulatory Visit | Attending: Endocrinology | Admitting: Endocrinology

## 2015-07-11 DIAGNOSIS — E041 Nontoxic single thyroid nodule: Secondary | ICD-10-CM

## 2015-07-12 ENCOUNTER — Telehealth: Payer: Self-pay | Admitting: Endocrinology

## 2015-07-12 NOTE — Telephone Encounter (Signed)
Pt requesting [place] results please

## 2015-07-12 NOTE — Telephone Encounter (Signed)
Results are not available at this time. Pt will be contacted once results are received.

## 2016-01-08 ENCOUNTER — Ambulatory Visit: Payer: Self-pay | Admitting: Endocrinology

## 2016-12-03 DIAGNOSIS — M1712 Unilateral primary osteoarthritis, left knee: Secondary | ICD-10-CM | POA: Diagnosis not present

## 2016-12-03 DIAGNOSIS — M1711 Unilateral primary osteoarthritis, right knee: Secondary | ICD-10-CM | POA: Diagnosis not present

## 2016-12-23 DIAGNOSIS — M170 Bilateral primary osteoarthritis of knee: Secondary | ICD-10-CM | POA: Diagnosis not present

## 2016-12-23 DIAGNOSIS — G8929 Other chronic pain: Secondary | ICD-10-CM | POA: Diagnosis not present

## 2016-12-25 DIAGNOSIS — G8929 Other chronic pain: Secondary | ICD-10-CM | POA: Diagnosis not present

## 2016-12-25 DIAGNOSIS — M170 Bilateral primary osteoarthritis of knee: Secondary | ICD-10-CM | POA: Diagnosis not present

## 2017-01-23 DIAGNOSIS — R262 Difficulty in walking, not elsewhere classified: Secondary | ICD-10-CM | POA: Diagnosis not present

## 2017-01-23 DIAGNOSIS — M25561 Pain in right knee: Secondary | ICD-10-CM | POA: Diagnosis not present

## 2017-01-23 DIAGNOSIS — M170 Bilateral primary osteoarthritis of knee: Secondary | ICD-10-CM | POA: Diagnosis not present

## 2017-01-23 DIAGNOSIS — M25661 Stiffness of right knee, not elsewhere classified: Secondary | ICD-10-CM | POA: Diagnosis not present

## 2017-02-02 ENCOUNTER — Other Ambulatory Visit: Payer: Self-pay | Admitting: Obstetrics & Gynecology

## 2017-02-02 DIAGNOSIS — N6320 Unspecified lump in the left breast, unspecified quadrant: Secondary | ICD-10-CM

## 2017-02-02 DIAGNOSIS — N63 Unspecified lump in unspecified breast: Secondary | ICD-10-CM | POA: Diagnosis not present

## 2017-02-02 DIAGNOSIS — N6311 Unspecified lump in the right breast, upper outer quadrant: Secondary | ICD-10-CM | POA: Diagnosis not present

## 2017-02-02 DIAGNOSIS — N6312 Unspecified lump in the right breast, upper inner quadrant: Secondary | ICD-10-CM | POA: Diagnosis not present

## 2017-02-02 DIAGNOSIS — N329 Bladder disorder, unspecified: Secondary | ICD-10-CM | POA: Diagnosis not present

## 2017-02-03 ENCOUNTER — Other Ambulatory Visit: Payer: Self-pay | Admitting: Obstetrics & Gynecology

## 2017-02-03 ENCOUNTER — Ambulatory Visit
Admission: RE | Admit: 2017-02-03 | Discharge: 2017-02-03 | Disposition: A | Discharge status: Home / Self Care | Payer: No Typology Code available for payment source | Source: Ambulatory Visit | Attending: Obstetrics & Gynecology | Admitting: Obstetrics & Gynecology

## 2017-02-03 DIAGNOSIS — N6310 Unspecified lump in the right breast, unspecified quadrant: Secondary | ICD-10-CM

## 2017-02-03 DIAGNOSIS — R262 Difficulty in walking, not elsewhere classified: Secondary | ICD-10-CM | POA: Diagnosis not present

## 2017-02-03 DIAGNOSIS — M25661 Stiffness of right knee, not elsewhere classified: Secondary | ICD-10-CM | POA: Diagnosis not present

## 2017-02-03 DIAGNOSIS — N6320 Unspecified lump in the left breast, unspecified quadrant: Secondary | ICD-10-CM

## 2017-02-03 DIAGNOSIS — M25561 Pain in right knee: Secondary | ICD-10-CM | POA: Diagnosis not present

## 2017-02-03 DIAGNOSIS — N6312 Unspecified lump in the right breast, upper inner quadrant: Secondary | ICD-10-CM | POA: Diagnosis not present

## 2017-02-03 DIAGNOSIS — N6001 Solitary cyst of right breast: Secondary | ICD-10-CM | POA: Diagnosis not present

## 2017-02-03 DIAGNOSIS — N6311 Unspecified lump in the right breast, upper outer quadrant: Secondary | ICD-10-CM | POA: Diagnosis not present

## 2017-02-03 DIAGNOSIS — M170 Bilateral primary osteoarthritis of knee: Secondary | ICD-10-CM | POA: Diagnosis not present

## 2017-02-17 DIAGNOSIS — M170 Bilateral primary osteoarthritis of knee: Secondary | ICD-10-CM | POA: Diagnosis not present

## 2017-02-17 DIAGNOSIS — M25661 Stiffness of right knee, not elsewhere classified: Secondary | ICD-10-CM | POA: Diagnosis not present

## 2017-02-17 DIAGNOSIS — R262 Difficulty in walking, not elsewhere classified: Secondary | ICD-10-CM | POA: Diagnosis not present

## 2017-02-17 DIAGNOSIS — M25561 Pain in right knee: Secondary | ICD-10-CM | POA: Diagnosis not present

## 2017-03-02 DIAGNOSIS — M9901 Segmental and somatic dysfunction of cervical region: Secondary | ICD-10-CM | POA: Diagnosis not present

## 2017-03-02 DIAGNOSIS — M9902 Segmental and somatic dysfunction of thoracic region: Secondary | ICD-10-CM | POA: Diagnosis not present

## 2017-03-02 DIAGNOSIS — R51 Headache: Secondary | ICD-10-CM | POA: Diagnosis not present

## 2017-03-02 DIAGNOSIS — M531 Cervicobrachial syndrome: Secondary | ICD-10-CM | POA: Diagnosis not present

## 2017-03-11 DIAGNOSIS — M9902 Segmental and somatic dysfunction of thoracic region: Secondary | ICD-10-CM | POA: Diagnosis not present

## 2017-03-11 DIAGNOSIS — M9901 Segmental and somatic dysfunction of cervical region: Secondary | ICD-10-CM | POA: Diagnosis not present

## 2017-03-11 DIAGNOSIS — M531 Cervicobrachial syndrome: Secondary | ICD-10-CM | POA: Diagnosis not present

## 2017-03-11 DIAGNOSIS — R51 Headache: Secondary | ICD-10-CM | POA: Diagnosis not present

## 2017-04-13 DIAGNOSIS — L409 Psoriasis, unspecified: Secondary | ICD-10-CM | POA: Diagnosis not present

## 2017-04-13 DIAGNOSIS — D229 Melanocytic nevi, unspecified: Secondary | ICD-10-CM | POA: Diagnosis not present

## 2017-04-13 DIAGNOSIS — L720 Epidermal cyst: Secondary | ICD-10-CM | POA: Diagnosis not present

## 2017-04-13 DIAGNOSIS — L280 Lichen simplex chronicus: Secondary | ICD-10-CM | POA: Diagnosis not present

## 2017-04-20 DIAGNOSIS — M531 Cervicobrachial syndrome: Secondary | ICD-10-CM | POA: Diagnosis not present

## 2017-04-20 DIAGNOSIS — M9901 Segmental and somatic dysfunction of cervical region: Secondary | ICD-10-CM | POA: Diagnosis not present

## 2017-04-20 DIAGNOSIS — R51 Headache: Secondary | ICD-10-CM | POA: Diagnosis not present

## 2017-04-20 DIAGNOSIS — M9902 Segmental and somatic dysfunction of thoracic region: Secondary | ICD-10-CM | POA: Diagnosis not present

## 2017-04-23 DIAGNOSIS — M531 Cervicobrachial syndrome: Secondary | ICD-10-CM | POA: Diagnosis not present

## 2017-04-23 DIAGNOSIS — M791 Myalgia: Secondary | ICD-10-CM | POA: Diagnosis not present

## 2017-04-23 DIAGNOSIS — M9902 Segmental and somatic dysfunction of thoracic region: Secondary | ICD-10-CM | POA: Diagnosis not present

## 2017-04-23 DIAGNOSIS — M9901 Segmental and somatic dysfunction of cervical region: Secondary | ICD-10-CM | POA: Diagnosis not present

## 2017-05-14 DIAGNOSIS — M531 Cervicobrachial syndrome: Secondary | ICD-10-CM | POA: Diagnosis not present

## 2017-05-14 DIAGNOSIS — M9901 Segmental and somatic dysfunction of cervical region: Secondary | ICD-10-CM | POA: Diagnosis not present

## 2017-05-14 DIAGNOSIS — M9902 Segmental and somatic dysfunction of thoracic region: Secondary | ICD-10-CM | POA: Diagnosis not present

## 2017-05-14 DIAGNOSIS — M791 Myalgia: Secondary | ICD-10-CM | POA: Diagnosis not present

## 2017-05-27 DIAGNOSIS — R51 Headache: Secondary | ICD-10-CM | POA: Diagnosis not present

## 2017-05-27 DIAGNOSIS — M9901 Segmental and somatic dysfunction of cervical region: Secondary | ICD-10-CM | POA: Diagnosis not present

## 2017-05-27 DIAGNOSIS — M531 Cervicobrachial syndrome: Secondary | ICD-10-CM | POA: Diagnosis not present

## 2017-05-27 DIAGNOSIS — M9902 Segmental and somatic dysfunction of thoracic region: Secondary | ICD-10-CM | POA: Diagnosis not present

## 2017-06-03 DIAGNOSIS — L409 Psoriasis, unspecified: Secondary | ICD-10-CM | POA: Diagnosis not present

## 2017-06-03 DIAGNOSIS — D229 Melanocytic nevi, unspecified: Secondary | ICD-10-CM | POA: Diagnosis not present

## 2017-06-03 DIAGNOSIS — L7212 Trichodermal cyst: Secondary | ICD-10-CM | POA: Diagnosis not present

## 2017-06-03 DIAGNOSIS — L720 Epidermal cyst: Secondary | ICD-10-CM | POA: Diagnosis not present

## 2017-06-15 DIAGNOSIS — L648 Other androgenic alopecia: Secondary | ICD-10-CM | POA: Diagnosis not present

## 2017-06-15 DIAGNOSIS — L650 Telogen effluvium: Secondary | ICD-10-CM | POA: Diagnosis not present

## 2017-06-15 DIAGNOSIS — L905 Scar conditions and fibrosis of skin: Secondary | ICD-10-CM | POA: Diagnosis not present

## 2017-06-15 DIAGNOSIS — L820 Inflamed seborrheic keratosis: Secondary | ICD-10-CM | POA: Diagnosis not present

## 2017-06-16 DIAGNOSIS — M170 Bilateral primary osteoarthritis of knee: Secondary | ICD-10-CM | POA: Diagnosis not present

## 2017-06-25 DIAGNOSIS — M9901 Segmental and somatic dysfunction of cervical region: Secondary | ICD-10-CM | POA: Diagnosis not present

## 2017-06-25 DIAGNOSIS — R51 Headache: Secondary | ICD-10-CM | POA: Diagnosis not present

## 2017-06-25 DIAGNOSIS — M531 Cervicobrachial syndrome: Secondary | ICD-10-CM | POA: Diagnosis not present

## 2017-06-25 DIAGNOSIS — M9902 Segmental and somatic dysfunction of thoracic region: Secondary | ICD-10-CM | POA: Diagnosis not present

## 2017-07-10 DIAGNOSIS — M9902 Segmental and somatic dysfunction of thoracic region: Secondary | ICD-10-CM | POA: Diagnosis not present

## 2017-07-10 DIAGNOSIS — R51 Headache: Secondary | ICD-10-CM | POA: Diagnosis not present

## 2017-07-10 DIAGNOSIS — M9901 Segmental and somatic dysfunction of cervical region: Secondary | ICD-10-CM | POA: Diagnosis not present

## 2017-07-10 DIAGNOSIS — M531 Cervicobrachial syndrome: Secondary | ICD-10-CM | POA: Diagnosis not present

## 2017-07-18 DIAGNOSIS — M9902 Segmental and somatic dysfunction of thoracic region: Secondary | ICD-10-CM | POA: Diagnosis not present

## 2017-07-18 DIAGNOSIS — M531 Cervicobrachial syndrome: Secondary | ICD-10-CM | POA: Diagnosis not present

## 2017-07-18 DIAGNOSIS — M9901 Segmental and somatic dysfunction of cervical region: Secondary | ICD-10-CM | POA: Diagnosis not present

## 2017-07-18 DIAGNOSIS — R51 Headache: Secondary | ICD-10-CM | POA: Diagnosis not present

## 2017-09-30 DIAGNOSIS — M542 Cervicalgia: Secondary | ICD-10-CM | POA: Diagnosis not present

## 2017-09-30 DIAGNOSIS — Z0000 Encounter for general adult medical examination without abnormal findings: Secondary | ICD-10-CM | POA: Diagnosis not present

## 2017-09-30 DIAGNOSIS — Z1389 Encounter for screening for other disorder: Secondary | ICD-10-CM | POA: Diagnosis not present

## 2017-09-30 DIAGNOSIS — E28319 Asymptomatic premature menopause: Secondary | ICD-10-CM | POA: Diagnosis not present

## 2017-09-30 DIAGNOSIS — M859 Disorder of bone density and structure, unspecified: Secondary | ICD-10-CM | POA: Diagnosis not present

## 2017-09-30 DIAGNOSIS — E041 Nontoxic single thyroid nodule: Secondary | ICD-10-CM | POA: Diagnosis not present

## 2017-09-30 DIAGNOSIS — Z800 Family history of malignant neoplasm of digestive organs: Secondary | ICD-10-CM | POA: Diagnosis not present

## 2017-10-05 ENCOUNTER — Encounter: Payer: Self-pay | Admitting: Gastroenterology

## 2017-10-06 ENCOUNTER — Other Ambulatory Visit: Payer: Self-pay | Admitting: Internal Medicine

## 2017-10-06 DIAGNOSIS — E041 Nontoxic single thyroid nodule: Secondary | ICD-10-CM

## 2017-10-09 ENCOUNTER — Ambulatory Visit
Admission: RE | Admit: 2017-10-09 | Discharge: 2017-10-09 | Disposition: A | Discharge status: Home / Self Care | Payer: No Typology Code available for payment source | Source: Ambulatory Visit | Attending: Internal Medicine | Admitting: Internal Medicine

## 2017-10-09 DIAGNOSIS — E042 Nontoxic multinodular goiter: Secondary | ICD-10-CM | POA: Diagnosis not present

## 2017-10-09 DIAGNOSIS — E041 Nontoxic single thyroid nodule: Secondary | ICD-10-CM

## 2017-10-16 DIAGNOSIS — R51 Headache: Secondary | ICD-10-CM | POA: Diagnosis not present

## 2017-10-16 DIAGNOSIS — M9902 Segmental and somatic dysfunction of thoracic region: Secondary | ICD-10-CM | POA: Diagnosis not present

## 2017-10-16 DIAGNOSIS — M9901 Segmental and somatic dysfunction of cervical region: Secondary | ICD-10-CM | POA: Diagnosis not present

## 2017-10-16 DIAGNOSIS — M531 Cervicobrachial syndrome: Secondary | ICD-10-CM | POA: Diagnosis not present

## 2017-10-22 DIAGNOSIS — M25512 Pain in left shoulder: Secondary | ICD-10-CM | POA: Diagnosis not present

## 2017-10-22 DIAGNOSIS — M1990 Unspecified osteoarthritis, unspecified site: Secondary | ICD-10-CM | POA: Diagnosis not present

## 2017-10-22 DIAGNOSIS — Z6836 Body mass index (BMI) 36.0-36.9, adult: Secondary | ICD-10-CM | POA: Diagnosis not present

## 2017-10-26 DIAGNOSIS — M531 Cervicobrachial syndrome: Secondary | ICD-10-CM | POA: Diagnosis not present

## 2017-10-26 DIAGNOSIS — R51 Headache: Secondary | ICD-10-CM | POA: Diagnosis not present

## 2017-10-26 DIAGNOSIS — M9901 Segmental and somatic dysfunction of cervical region: Secondary | ICD-10-CM | POA: Diagnosis not present

## 2017-10-26 DIAGNOSIS — M9902 Segmental and somatic dysfunction of thoracic region: Secondary | ICD-10-CM | POA: Diagnosis not present

## 2017-10-28 DIAGNOSIS — M859 Disorder of bone density and structure, unspecified: Secondary | ICD-10-CM | POA: Diagnosis not present

## 2017-11-11 DIAGNOSIS — M9901 Segmental and somatic dysfunction of cervical region: Secondary | ICD-10-CM | POA: Diagnosis not present

## 2017-11-11 DIAGNOSIS — M9902 Segmental and somatic dysfunction of thoracic region: Secondary | ICD-10-CM | POA: Diagnosis not present

## 2017-11-11 DIAGNOSIS — R51 Headache: Secondary | ICD-10-CM | POA: Diagnosis not present

## 2017-11-11 DIAGNOSIS — M531 Cervicobrachial syndrome: Secondary | ICD-10-CM | POA: Diagnosis not present

## 2017-11-20 DIAGNOSIS — M9901 Segmental and somatic dysfunction of cervical region: Secondary | ICD-10-CM | POA: Diagnosis not present

## 2017-11-20 DIAGNOSIS — M531 Cervicobrachial syndrome: Secondary | ICD-10-CM | POA: Diagnosis not present

## 2017-11-20 DIAGNOSIS — R51 Headache: Secondary | ICD-10-CM | POA: Diagnosis not present

## 2017-11-20 DIAGNOSIS — M9902 Segmental and somatic dysfunction of thoracic region: Secondary | ICD-10-CM | POA: Diagnosis not present

## 2017-11-24 ENCOUNTER — Ambulatory Visit (AMBULATORY_SURGERY_CENTER): Payer: Self-pay | Admitting: *Deleted

## 2017-11-24 ENCOUNTER — Other Ambulatory Visit: Payer: Self-pay

## 2017-11-24 VITALS — Ht 61.5 in | Wt 192.0 lb

## 2017-11-24 DIAGNOSIS — Z800 Family history of malignant neoplasm of digestive organs: Secondary | ICD-10-CM

## 2017-11-24 MED ORDER — NA SULFATE-K SULFATE-MG SULF 17.5-3.13-1.6 GM/177ML PO SOLN: 1.0000 | Freq: Once | ORAL | 0 refills | Status: AC

## 2017-11-24 MED ORDER — PEG-KCL-NACL-NASULF-NA ASC-C 140 G PO SOLR: 1.0000 | ORAL | 0 refills | Status: DC

## 2017-11-24 NOTE — Progress Notes (Signed)
No egg or soy allergy known to patient  No issues with past sedation with any surgeries  or procedures, no intubation problems  No diet pills per patient No home 02 use per patient  No blood thinners per patient  Pt denies issues with constipation  No A fib or A flutter  EMMI video sent to pt's e mail - pt declined  suprep 139.29 per [facility]- pt states miralax is too much volume Will do plenvu sample   Lot [id number]  Exp 04-2019 as directed

## 2017-11-27 ENCOUNTER — Encounter: Payer: Self-pay | Admitting: Gastroenterology

## 2017-12-04 DIAGNOSIS — R51 Headache: Secondary | ICD-10-CM | POA: Diagnosis not present

## 2017-12-04 DIAGNOSIS — M9901 Segmental and somatic dysfunction of cervical region: Secondary | ICD-10-CM | POA: Diagnosis not present

## 2017-12-04 DIAGNOSIS — M531 Cervicobrachial syndrome: Secondary | ICD-10-CM | POA: Diagnosis not present

## 2017-12-04 DIAGNOSIS — M9902 Segmental and somatic dysfunction of thoracic region: Secondary | ICD-10-CM | POA: Diagnosis not present

## 2017-12-07 ENCOUNTER — Encounter: Payer: Self-pay | Admitting: Gastroenterology

## 2017-12-08 ENCOUNTER — Ambulatory Visit (AMBULATORY_SURGERY_CENTER): Payer: BLUE CROSS/BLUE SHIELD | Admitting: Gastroenterology

## 2017-12-08 ENCOUNTER — Encounter: Payer: Self-pay | Admitting: Gastroenterology

## 2017-12-08 ENCOUNTER — Other Ambulatory Visit: Payer: Self-pay

## 2017-12-08 VITALS — BP 111/66 | HR 88 | Temp 98.6°F | Resp 15 | Ht 61.0 in | Wt 192.0 lb

## 2017-12-08 DIAGNOSIS — D122 Benign neoplasm of ascending colon: Secondary | ICD-10-CM | POA: Diagnosis not present

## 2017-12-08 DIAGNOSIS — D128 Benign neoplasm of rectum: Secondary | ICD-10-CM | POA: Diagnosis not present

## 2017-12-08 DIAGNOSIS — Z1212 Encounter for screening for malignant neoplasm of rectum: Secondary | ICD-10-CM

## 2017-12-08 DIAGNOSIS — D123 Benign neoplasm of transverse colon: Secondary | ICD-10-CM | POA: Diagnosis not present

## 2017-12-08 DIAGNOSIS — Z800 Family history of malignant neoplasm of digestive organs: Secondary | ICD-10-CM

## 2017-12-08 DIAGNOSIS — Z1211 Encounter for screening for malignant neoplasm of colon: Secondary | ICD-10-CM | POA: Diagnosis not present

## 2017-12-08 MED ORDER — SODIUM CHLORIDE 0.9 % IV SOLN: 500.0000 mL | Freq: Once | INTRAVENOUS | Status: DC

## 2017-12-08 NOTE — Patient Instructions (Signed)
YOU HAD AN ENDOSCOPIC PROCEDURE TODAY AT THE  ENDOSCOPY CENTER:   Refer to the procedure report that was given to you for any specific questions about what was found during the examination.  If the procedure report does not answer your questions, please call your gastroenterologist to clarify.  If you requested that your care partner not be given the details of your procedure findings, then the procedure report has been included in a sealed envelope for you to review at your convenience later.  YOU SHOULD EXPECT: Some feelings of bloating in the abdomen. Passage of more gas than usual.  Walking can help get rid of the air that was put into your GI tract during the procedure and reduce the bloating. If you had a lower endoscopy (such as a colonoscopy or flexible sigmoidoscopy) you may notice spotting of blood in your stool or on the toilet paper. If you underwent a bowel prep for your procedure, you may not have a normal bowel movement for a few days.  Please Note:  You might notice some irritation and congestion in your nose or some drainage.  This is from the oxygen used during your procedure.  There is no need for concern and it should clear up in a day or so.  SYMPTOMS TO REPORT IMMEDIATELY:   Following lower endoscopy (colonoscopy or flexible sigmoidoscopy):  Excessive amounts of blood in the stool  Significant tenderness or worsening of abdominal pains  Swelling of the abdomen that is new, acute  Fever of 100F or higher  Please see handouts given to you on polyps.  For urgent or emergent issues, a gastroenterologist can be reached at any hour by calling [phone].   DIET:  We do recommend a small meal at first, but then you may proceed to your regular diet.  Drink plenty of fluids but you should avoid alcoholic beverages for 24 hours.  ACTIVITY:  You should plan to take it easy for the rest of today and you should NOT DRIVE or use heavy machinery until tomorrow (because of the  sedation medicines used during the test).    FOLLOW UP: Our staff will call the number listed on your records the next business day following your procedure to check on you and address any questions or concerns that you may have regarding the information given to you following your procedure. If we do not reach you, we will leave a message.  However, if you are feeling well and you are not experiencing any problems, there is no need to return our call.  We will assume that you have returned to your regular daily activities without incident.  If any biopsies were taken you will be contacted by phone or by letter within the next 1-3 weeks.  Please call us at [phone] if you have not heard about the biopsies in 3 weeks.    SIGNATURES/CONFIDENTIALITY: You and/or your care partner have signed paperwork which will be entered into your electronic medical record.  These signatures attest to the fact that that the information above on your After Visit Summary has been reviewed and is understood.  Full responsibility of the confidentiality of this discharge information lies with you and/or your care-partner.  Thank you for letting us take care of your healthcare needs today. 

## 2017-12-08 NOTE — Progress Notes (Signed)
Called to room to assist during endoscopic procedure.  Patient ID and intended procedure confirmed with present staff. Received instructions for my participation in the procedure from the performing physician.  

## 2017-12-08 NOTE — Op Note (Signed)
[place] Patient Name: Fatumata Kashani Procedure Date: 12/08/2017 10:51 AM MRN: [id number] Endoscopist: Remo Lipps P. Ithiel Liebler MD, MD Age: 62 Referring MD:  Date of Birth: 1956-02-13 Gender: Female Account #: [id number] Procedure:                Colonoscopy Indications:              Screening in patient at increased risk: Family                            history of 1st-degree relative with colorectal                            cancer (brother diagnosed age 16s) This is the                            patient's first colonoscopy Medicines:                Monitored Anesthesia Care Procedure:                Pre-Anesthesia Assessment:                           - Prior to the procedure, a History and Physical                            was performed, and patient medications and                            allergies were reviewed. The patient's tolerance of                            previous anesthesia was also reviewed. The risks                            and benefits of the procedure and the sedation                            options and risks were discussed with the patient.                            All questions were answered, and informed consent                            was obtained. Prior Anticoagulants: The patient has                            taken no previous anticoagulant or antiplatelet                            agents. ASA Grade Assessment: II - A patient with                            mild systemic disease. After reviewing the risks  and benefits, the patient was deemed in                            satisfactory condition to undergo the procedure.                           After obtaining informed consent, the colonoscope                            was passed under direct vision. Throughout the                            procedure, the patient's blood pressure, pulse, and                            oxygen saturations were  monitored continuously. The                            Model PCF-[id number]) scope was introduced                            through the anus and advanced to the the cecum,                            identified by appendiceal orifice and ileocecal                            valve. The colonoscopy was performed without                            difficulty. The patient tolerated the procedure                            well. The quality of the bowel preparation was                            good. The ileocecal valve, appendiceal orifice, and                            rectum were photographed. Scope In: 10:55:12 AM Scope Out: 11:09:56 AM Scope Withdrawal Time: 0 hours 12 minutes 52 seconds  Total Procedure Duration: 0 hours 14 minutes 44 seconds  Findings:                 The perianal and digital rectal examinations were                            normal.                           Two sessile polyps were found in the ascending                            colon. The polyps were 3 to 6 mm in size. These  polyps were removed with a cold snare. Resection                            and retrieval were complete.                           A 4 to 5 mm polyp was found in the transverse                            colon. The polyp was sessile. The polyp was removed                            with a cold snare. Resection and retrieval were                            complete.                           A 4 mm polyp was found in the rectum. The polyp was                            sessile. The polyp was removed with a cold snare.                            Resection and retrieval were complete.                           The exam was otherwise without abnormality on                            direct and retroflexion views. Complications:            No immediate complications. Estimated blood loss:                            Minimal. Estimated Blood Loss:     Estimated  blood loss was minimal. Impression:               - Two 3 to 6 mm polyps in the ascending colon,                            removed with a cold snare. Resected and retrieved.                           - One 4 to 5 mm polyp in the transverse colon,                            removed with a cold snare. Resected and retrieved.                           - One 4 mm polyp in the rectum, removed with a cold  snare. Resected and retrieved.                           - The examination was otherwise normal on direct                            and retroflexion views. Recommendation:           - Patient has a contact number available for                            emergencies. The signs and symptoms of potential                            delayed complications were discussed with the                            patient. Return to normal activities tomorrow.                            Written discharge instructions were provided to the                            patient.                           - Resume previous diet.                           - Continue present medications.                           - Await pathology results.                           - Repeat colonoscopy is recommended for                            surveillance. The colonoscopy date will be                            determined after pathology results from today's                            exam become available for review.                           - No ibuprofen, naproxen, or other non-steroidal                            anti-inflammatory drugs for 2 weeks after polyp                            removal. Remo Lipps P. Vann Okerlund MD, MD 12/08/2017 11:14:18 AM This report has been signed electronically.

## 2017-12-08 NOTE — Progress Notes (Signed)
Report given to PACU, vss 

## 2017-12-09 ENCOUNTER — Telehealth: Payer: Self-pay | Admitting: *Deleted

## 2017-12-09 NOTE — Telephone Encounter (Signed)
  Follow up Call-  Call back number 12/08/2017  Post procedure Call Back phone  # [phone]  Permission to leave phone message Yes  Some recent data might be hidden     Patient questions:  Do you have a fever, pain , or abdominal swelling? No. Pain Score  0 *  Have you tolerated food without any problems? Yes.    Have you been able to return to your normal activities? Yes.    Do you have any questions about your discharge instructions: Diet   No. Medications  No. Follow up visit  No.  Do you have questions or concerns about your Care? No.  Actions: * If pain score is 4 or above: No action needed, pain <4.

## 2017-12-10 ENCOUNTER — Encounter: Payer: Self-pay | Admitting: Gastroenterology

## 2017-12-21 DIAGNOSIS — R51 Headache: Secondary | ICD-10-CM | POA: Diagnosis not present

## 2017-12-21 DIAGNOSIS — M9901 Segmental and somatic dysfunction of cervical region: Secondary | ICD-10-CM | POA: Diagnosis not present

## 2017-12-21 DIAGNOSIS — M9902 Segmental and somatic dysfunction of thoracic region: Secondary | ICD-10-CM | POA: Diagnosis not present

## 2017-12-21 DIAGNOSIS — M531 Cervicobrachial syndrome: Secondary | ICD-10-CM | POA: Diagnosis not present

## 2017-12-24 DIAGNOSIS — Z6837 Body mass index (BMI) 37.0-37.9, adult: Secondary | ICD-10-CM | POA: Diagnosis not present

## 2017-12-24 DIAGNOSIS — B373 Candidiasis of vulva and vagina: Secondary | ICD-10-CM | POA: Diagnosis not present

## 2017-12-24 DIAGNOSIS — Z01419 Encounter for gynecological examination (general) (routine) without abnormal findings: Secondary | ICD-10-CM | POA: Diagnosis not present

## 2017-12-25 DIAGNOSIS — H2513 Age-related nuclear cataract, bilateral: Secondary | ICD-10-CM | POA: Diagnosis not present

## 2017-12-25 DIAGNOSIS — H524 Presbyopia: Secondary | ICD-10-CM | POA: Diagnosis not present

## 2018-01-02 DIAGNOSIS — M9901 Segmental and somatic dysfunction of cervical region: Secondary | ICD-10-CM | POA: Diagnosis not present

## 2018-01-02 DIAGNOSIS — M531 Cervicobrachial syndrome: Secondary | ICD-10-CM | POA: Diagnosis not present

## 2018-01-02 DIAGNOSIS — M9902 Segmental and somatic dysfunction of thoracic region: Secondary | ICD-10-CM | POA: Diagnosis not present

## 2018-01-02 DIAGNOSIS — R51 Headache: Secondary | ICD-10-CM | POA: Diagnosis not present

## 2018-01-05 DIAGNOSIS — G8929 Other chronic pain: Secondary | ICD-10-CM | POA: Diagnosis not present

## 2018-01-05 DIAGNOSIS — M170 Bilateral primary osteoarthritis of knee: Secondary | ICD-10-CM | POA: Diagnosis not present

## 2018-01-07 DIAGNOSIS — M25562 Pain in left knee: Secondary | ICD-10-CM | POA: Diagnosis not present

## 2018-01-07 DIAGNOSIS — M25561 Pain in right knee: Secondary | ICD-10-CM | POA: Diagnosis not present

## 2018-01-07 DIAGNOSIS — G8929 Other chronic pain: Secondary | ICD-10-CM | POA: Diagnosis not present

## 2018-02-04 IMAGING — US US THYROID
1 series · 13 of 25 positions shown · non-contrast
Comparison: 07/11/2015

CLINICAL DATA: Thyroid nodules

EXAM:
THYROID ULTRASOUND
TECHNIQUE: Ultrasound examination of the thyroid gland and adjacent soft
tissues was performed.

[Series 1: us thyroid · 0.06mm/px · 13 of 52 slices shown]
[im 1/52]
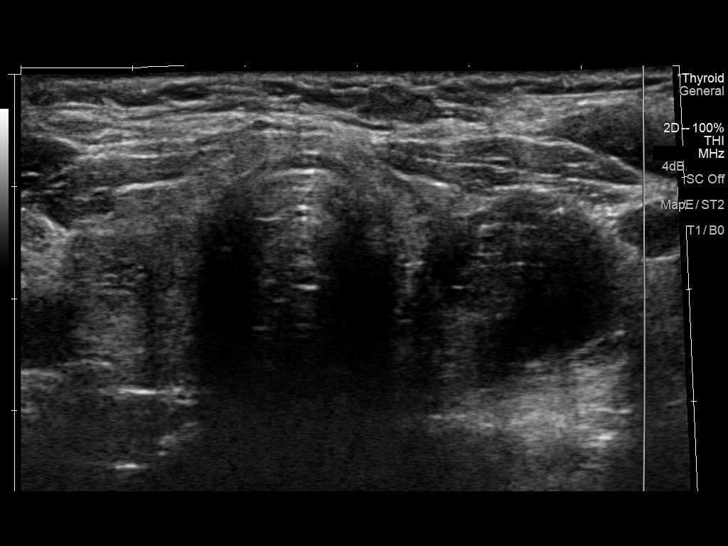
[im 5/52]
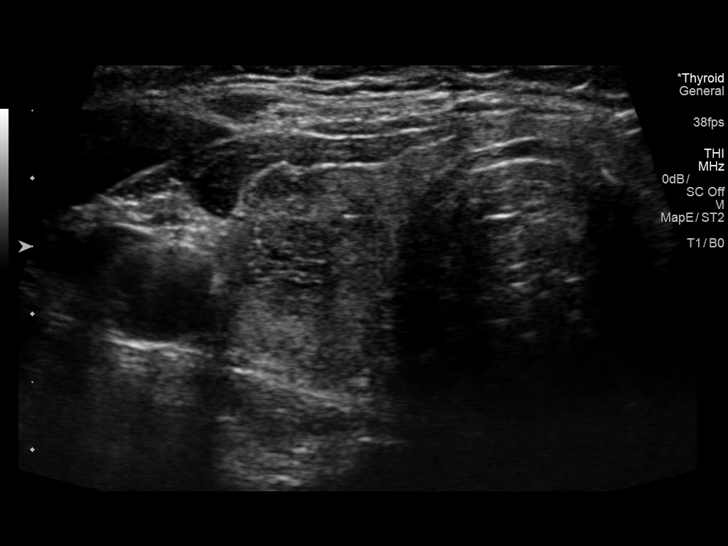
[im 9/52]
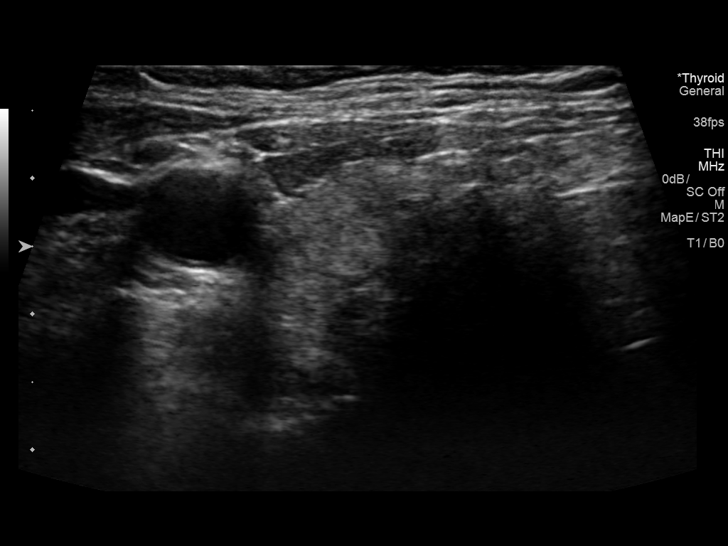
[im 13/52]
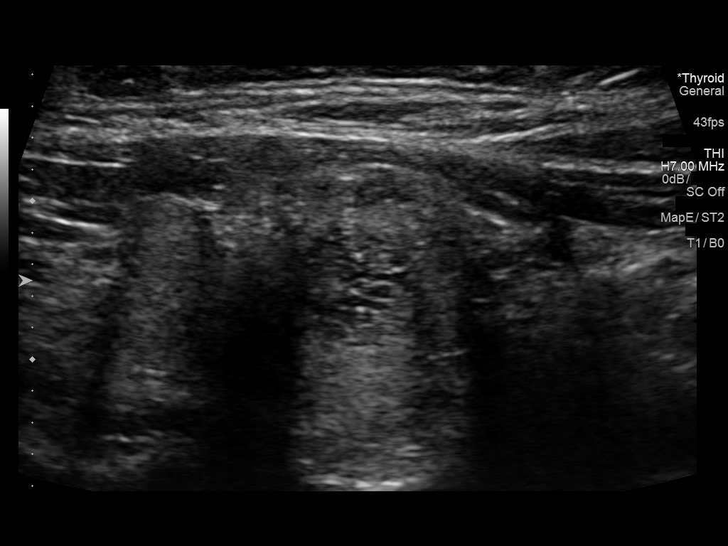
[im 18/52]
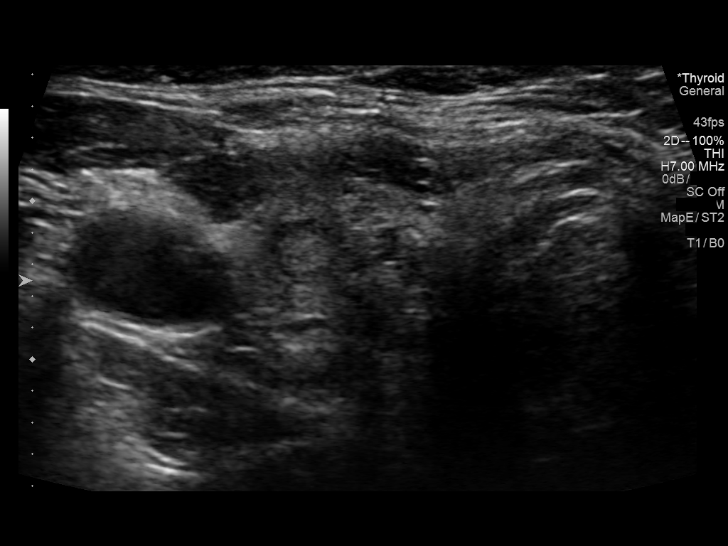
[im 22/52]
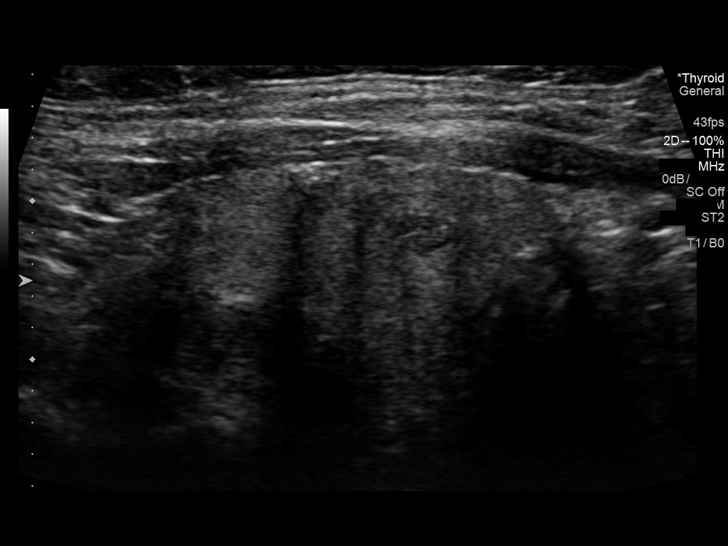
[im 26/52]
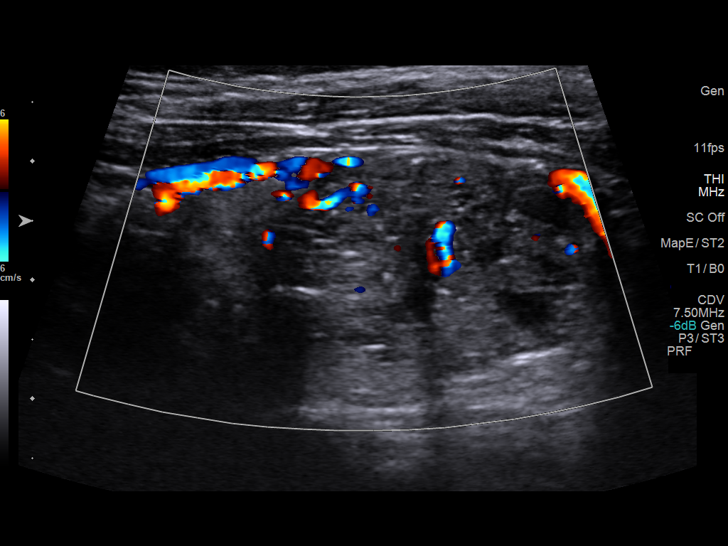
[im 30/52]
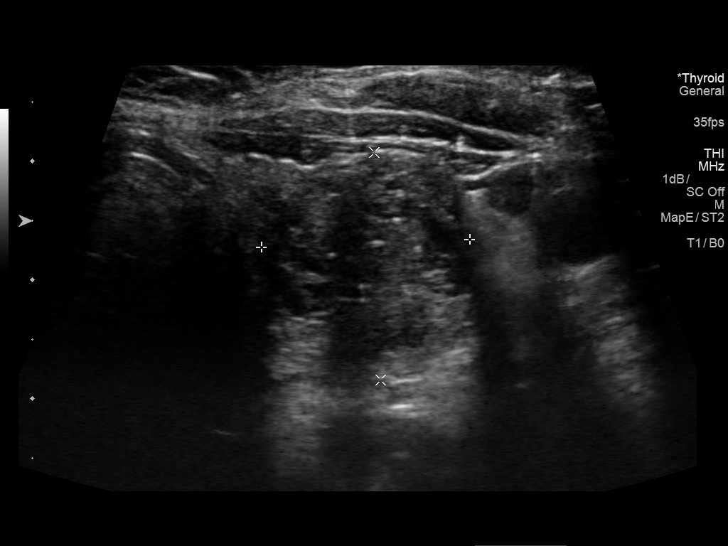
[im 35/52]
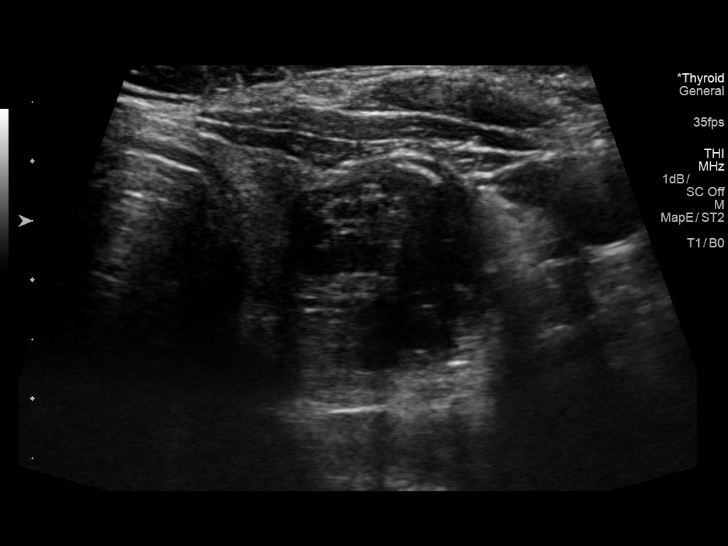
[im 39/52]
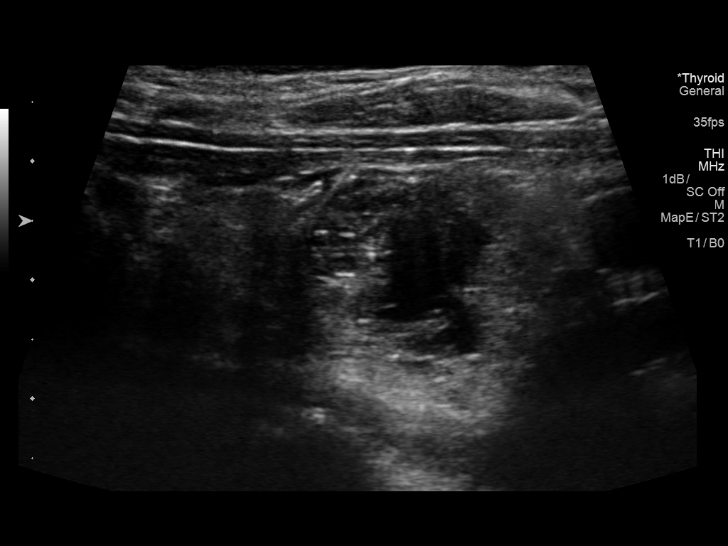
[im 43/52]
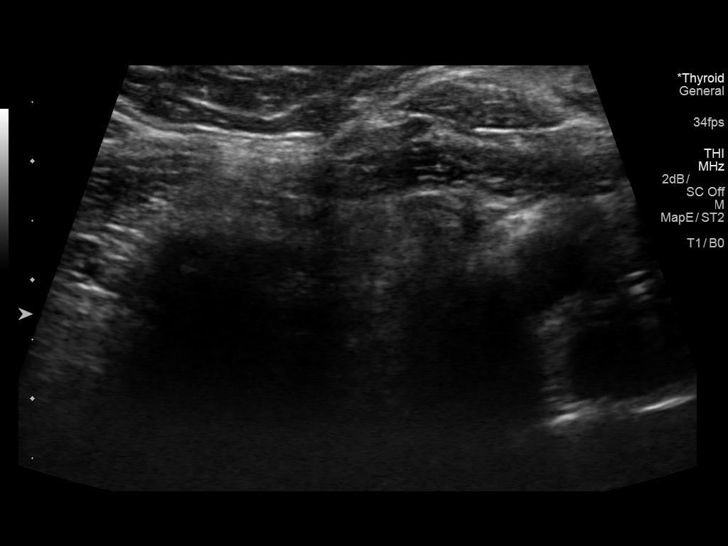
[im 47/52]
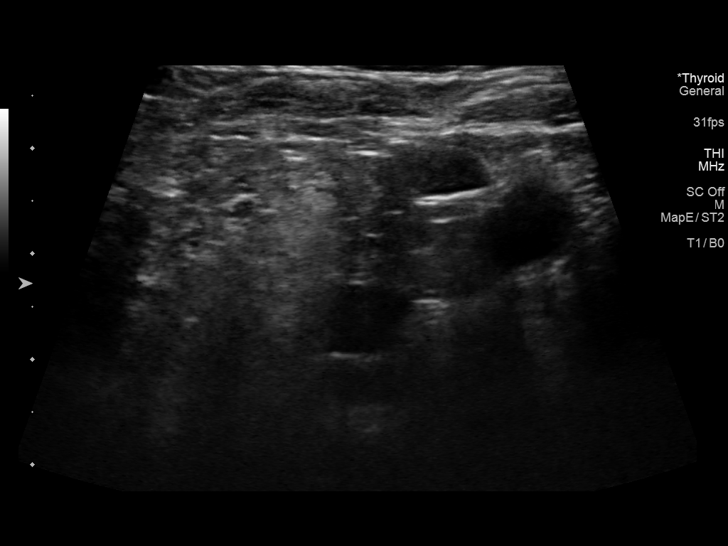
[im 52/52]
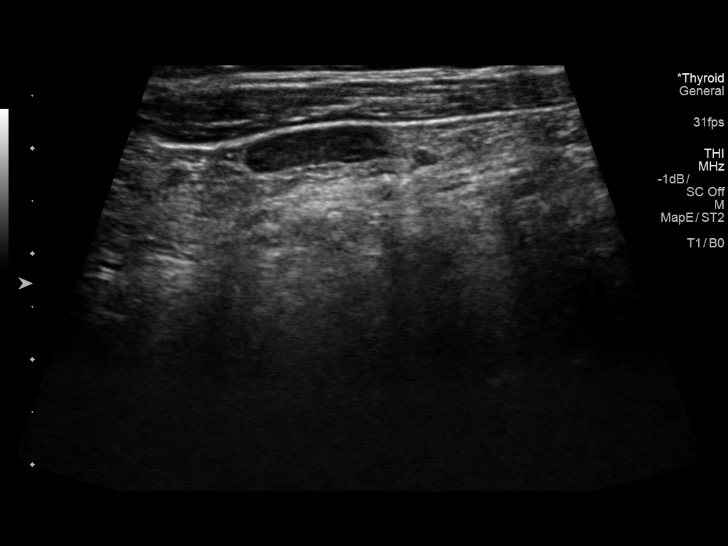

[13 of 25 positions shown; findings below may reference images not displayed]

FINDINGS: Parenchymal Echotexture: Markedly heterogenous

Isthmus: 2 mm

Right lobe: 3.3 x 2.6 x 1.4 cm, previously 3.4 x 1.8 x 1.5 cm

Left lobe: 4.2 x 2.1 x 2.3 cm, previously 4.4 x 2.0 x 2.3 cm

_________________________________________________________

Estimated total number of nodules >/= 1 cm: 1

Number of spongiform nodules >/=  2 cm not described below (TR1): 0

Number of mixed cystic and solid nodules >/= 1.5 cm not described
below (TR2): 0

_________________________________________________________

Nodule # 2:

Location: Left; Inferior

Maximum size: 2.7, previously 2.6 cm; Other 2 dimensions: 1.9 x 1.8,
previously 1.7 x 1.6 cm

Composition: mixed cystic and solid (1)

Echogenicity: isoechoic (1)

Shape: taller-than-wide (3)

Margins: ill-defined (0)

Echogenic foci: punctate echogenic foci (3)

ACR TI-RADS total points: 8.

ACR TI-RADS risk category: TR5 (>/= 7 points).

ACR TI-RADS recommendations:

**Given size (>/= 1.0 cm) and appearance, fine needle aspiration of
this highly suspicious nodule should be considered based on TI-RADS
criteria.

_________________________________________________________

Right thyroid mixed cystic/solid nodule also noted measuring only 7
mm. This would not meet criteria for any biopsy or follow-up and is
not fully detailed by TI RADS criteria.

No adenopathy.
IMPRESSION: 2.7 cm left inferior TR 5 nodule meets criteria for biopsy as above.

The above is in keeping with the ACR TI-RADS recommendations - [HOSPITAL] 5054;[DATE].

## 2018-03-17 ENCOUNTER — Other Ambulatory Visit: Payer: Self-pay | Admitting: Internal Medicine

## 2018-03-17 DIAGNOSIS — M1711 Unilateral primary osteoarthritis, right knee: Secondary | ICD-10-CM | POA: Diagnosis not present

## 2018-03-17 DIAGNOSIS — E041 Nontoxic single thyroid nodule: Secondary | ICD-10-CM

## 2018-04-08 ENCOUNTER — Ambulatory Visit
Admission: RE | Admit: 2018-04-08 | Discharge: 2018-04-08 | Disposition: A | Discharge status: Home / Self Care | Payer: BLUE CROSS/BLUE SHIELD | Source: Ambulatory Visit | Attending: Internal Medicine | Admitting: Internal Medicine

## 2018-04-08 DIAGNOSIS — E041 Nontoxic single thyroid nodule: Secondary | ICD-10-CM | POA: Diagnosis not present

## 2018-04-09 ENCOUNTER — Other Ambulatory Visit: Payer: Self-pay | Admitting: Internal Medicine

## 2018-04-09 DIAGNOSIS — E041 Nontoxic single thyroid nodule: Secondary | ICD-10-CM

## 2018-04-14 DIAGNOSIS — M859 Disorder of bone density and structure, unspecified: Secondary | ICD-10-CM | POA: Diagnosis not present

## 2018-04-14 DIAGNOSIS — R82998 Other abnormal findings in urine: Secondary | ICD-10-CM | POA: Diagnosis not present

## 2018-04-14 DIAGNOSIS — E041 Nontoxic single thyroid nodule: Secondary | ICD-10-CM | POA: Diagnosis not present

## 2018-04-14 DIAGNOSIS — Z0000 Encounter for general adult medical examination without abnormal findings: Secondary | ICD-10-CM | POA: Diagnosis not present

## 2018-04-19 DIAGNOSIS — K219 Gastro-esophageal reflux disease without esophagitis: Secondary | ICD-10-CM | POA: Diagnosis not present

## 2018-04-19 DIAGNOSIS — Z1389 Encounter for screening for other disorder: Secondary | ICD-10-CM | POA: Diagnosis not present

## 2018-04-19 DIAGNOSIS — N3281 Overactive bladder: Secondary | ICD-10-CM | POA: Diagnosis not present

## 2018-04-19 DIAGNOSIS — M859 Disorder of bone density and structure, unspecified: Secondary | ICD-10-CM | POA: Diagnosis not present

## 2018-04-19 DIAGNOSIS — Z0000 Encounter for general adult medical examination without abnormal findings: Secondary | ICD-10-CM | POA: Diagnosis not present

## 2018-04-19 DIAGNOSIS — R600 Localized edema: Secondary | ICD-10-CM | POA: Diagnosis not present

## 2018-04-21 ENCOUNTER — Other Ambulatory Visit: Payer: Self-pay

## 2018-04-27 ENCOUNTER — Ambulatory Visit
Admission: RE | Admit: 2018-04-27 | Discharge: 2018-04-27 | Disposition: A | Discharge status: Home / Self Care | Payer: BLUE CROSS/BLUE SHIELD | Source: Ambulatory Visit | Attending: Internal Medicine | Admitting: Internal Medicine

## 2018-04-27 ENCOUNTER — Other Ambulatory Visit (HOSPITAL_COMMUNITY)
Admission: RE | Admit: 2018-04-27 | Discharge: 2018-04-27 | Disposition: A | Discharge status: Home / Self Care | Payer: BLUE CROSS/BLUE SHIELD | Source: Ambulatory Visit | Attending: Interventional Radiology | Admitting: Interventional Radiology

## 2018-04-27 DIAGNOSIS — E041 Nontoxic single thyroid nodule: Secondary | ICD-10-CM

## 2018-05-11 DIAGNOSIS — M9902 Segmental and somatic dysfunction of thoracic region: Secondary | ICD-10-CM | POA: Diagnosis not present

## 2018-05-11 DIAGNOSIS — M531 Cervicobrachial syndrome: Secondary | ICD-10-CM | POA: Diagnosis not present

## 2018-05-11 DIAGNOSIS — R51 Headache: Secondary | ICD-10-CM | POA: Diagnosis not present

## 2018-05-11 DIAGNOSIS — M9901 Segmental and somatic dysfunction of cervical region: Secondary | ICD-10-CM | POA: Diagnosis not present

## 2018-05-24 DIAGNOSIS — M531 Cervicobrachial syndrome: Secondary | ICD-10-CM | POA: Diagnosis not present

## 2018-05-24 DIAGNOSIS — M9902 Segmental and somatic dysfunction of thoracic region: Secondary | ICD-10-CM | POA: Diagnosis not present

## 2018-05-24 DIAGNOSIS — M9901 Segmental and somatic dysfunction of cervical region: Secondary | ICD-10-CM | POA: Diagnosis not present

## 2018-05-24 DIAGNOSIS — R51 Headache: Secondary | ICD-10-CM | POA: Diagnosis not present

## 2018-06-04 DIAGNOSIS — M9901 Segmental and somatic dysfunction of cervical region: Secondary | ICD-10-CM | POA: Diagnosis not present

## 2018-06-04 DIAGNOSIS — R51 Headache: Secondary | ICD-10-CM | POA: Diagnosis not present

## 2018-06-04 DIAGNOSIS — M531 Cervicobrachial syndrome: Secondary | ICD-10-CM | POA: Diagnosis not present

## 2018-06-04 DIAGNOSIS — M9902 Segmental and somatic dysfunction of thoracic region: Secondary | ICD-10-CM | POA: Diagnosis not present

## 2018-07-09 DIAGNOSIS — M9902 Segmental and somatic dysfunction of thoracic region: Secondary | ICD-10-CM | POA: Diagnosis not present

## 2018-07-09 DIAGNOSIS — M531 Cervicobrachial syndrome: Secondary | ICD-10-CM | POA: Diagnosis not present

## 2018-07-09 DIAGNOSIS — R51 Headache: Secondary | ICD-10-CM | POA: Diagnosis not present

## 2018-07-09 DIAGNOSIS — M9901 Segmental and somatic dysfunction of cervical region: Secondary | ICD-10-CM | POA: Diagnosis not present

## 2018-07-30 DIAGNOSIS — J029 Acute pharyngitis, unspecified: Secondary | ICD-10-CM | POA: Diagnosis not present

## 2018-07-30 DIAGNOSIS — N3281 Overactive bladder: Secondary | ICD-10-CM | POA: Diagnosis not present

## 2018-07-30 DIAGNOSIS — R05 Cough: Secondary | ICD-10-CM | POA: Diagnosis not present

## 2018-07-30 DIAGNOSIS — R350 Frequency of micturition: Secondary | ICD-10-CM | POA: Diagnosis not present

## 2018-08-23 IMAGING — US US FNA BIOPSY THYROID 1ST LESION
1 series · 11 of 11 positions shown · non-contrast
Comparison: 04/08/2018

MEDICATIONS:
Lidocaine 1% subcutaneous

COMPLICATIONS:
None immediate.

INDICATION: Indeterminate thyroid nodule

EXAM:
ULTRASOUND GUIDED FINE NEEDLE ASPIRATION OF INDETERMINATE THYROID
NODULE
TECHNIQUE: Informed written consent was obtained from the patient after a
discussion of the risks, benefits and alternatives to treatment.
Questions regarding the procedure were encouraged and answered. A
timeout was performed prior to the initiation of the procedure.

[Series 1: us fna biopsy thyroid 1st lesion · 0.06mm/px · 11 acquisitions, 11 frames shown]
[im 1/11]
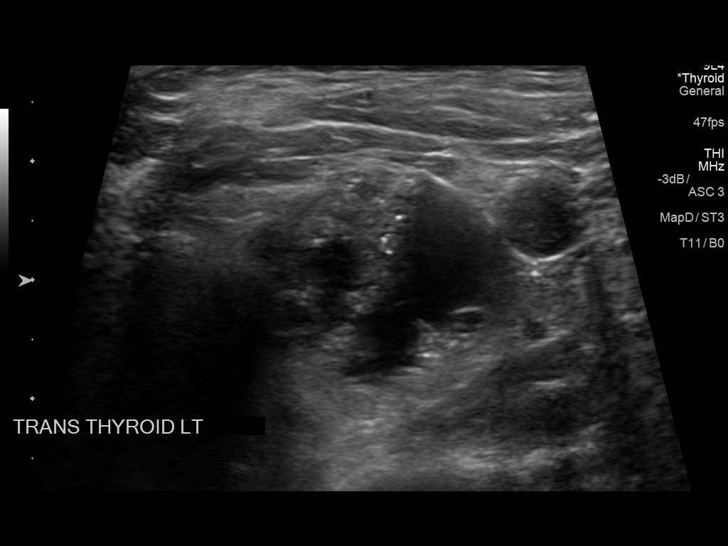
[im 2/11]
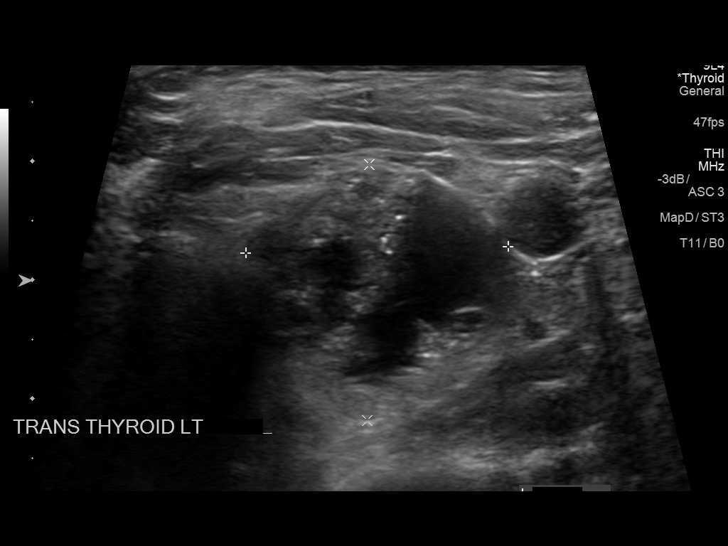
[im 3/11]
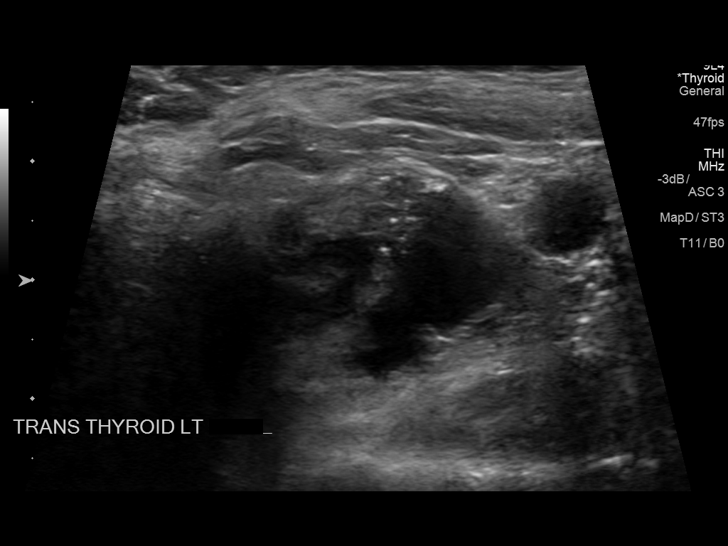
[im 4/11]
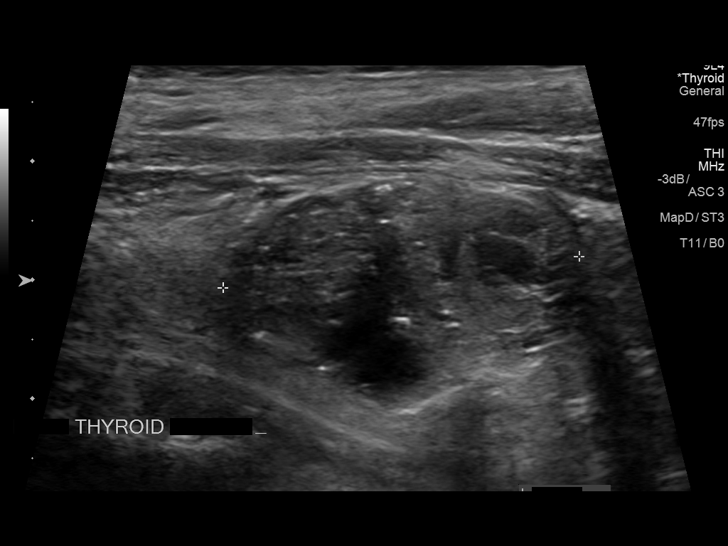
[im 5/11]
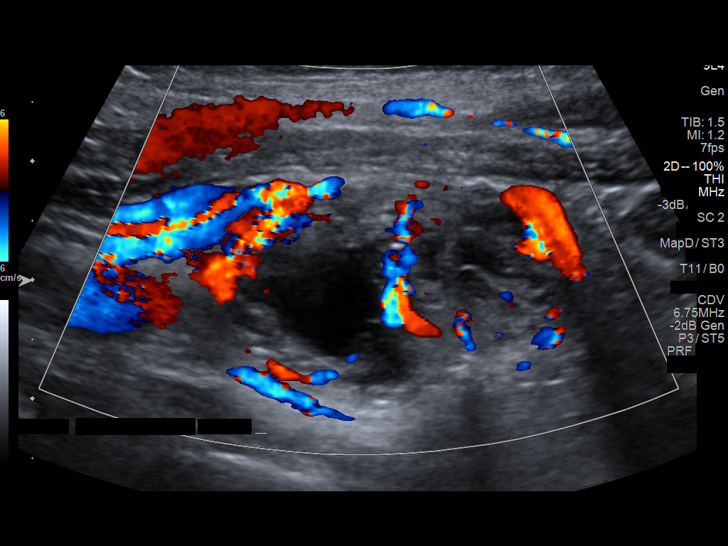
[im 6/11]
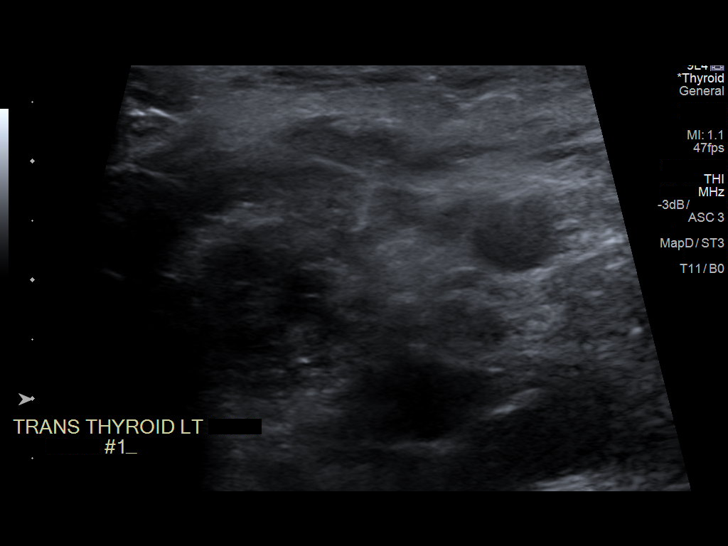
[im 7/11]
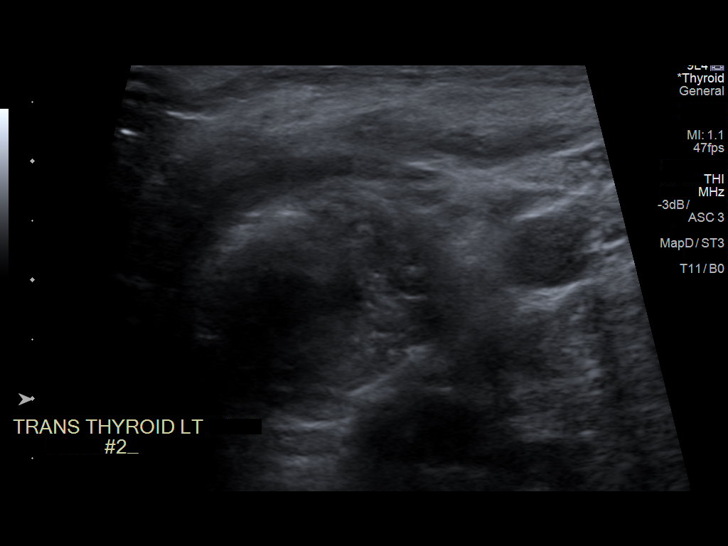
[im 8/11]
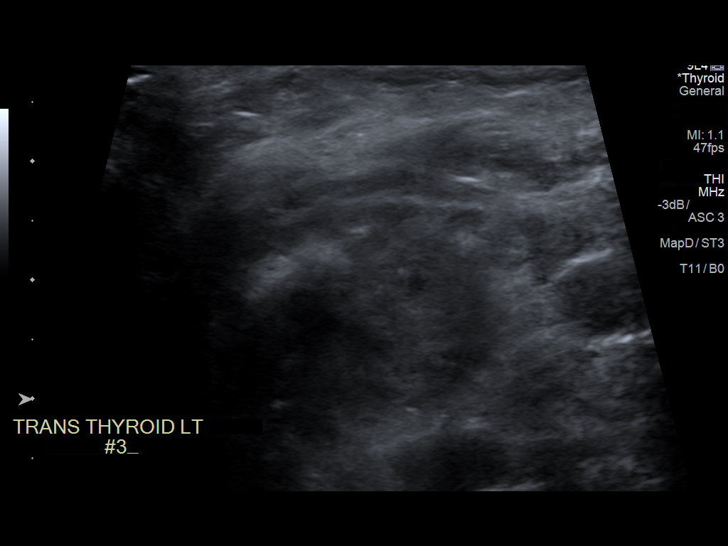
[im 9/11]
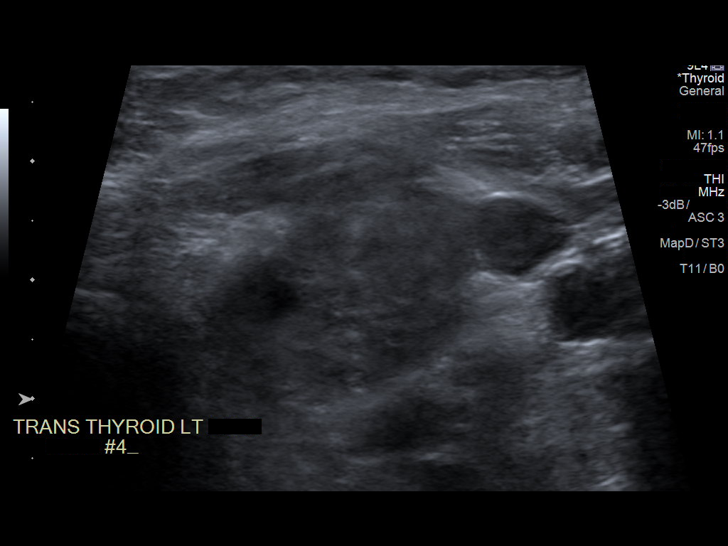
[im 10/11]
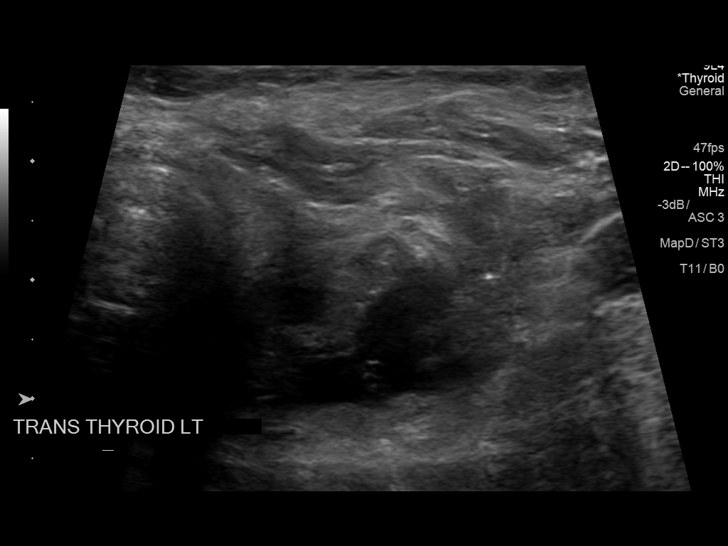
[im 11/11]
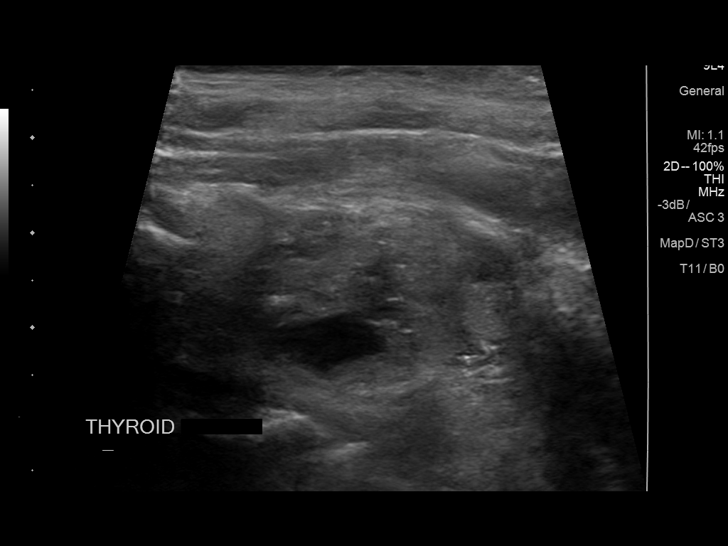

[11 of 11 positions shown; findings below may reference images not displayed]

Pre-procedural ultrasound scanning demonstrated unchanged size and
appearance of the indeterminate nodule within the inferior left
lobe.

The procedure was planned. The neck was prepped in the usual sterile
fashion, and a sterile drape was applied covering the operative
field. A timeout was performed prior to the initiation of the
procedure. Local anesthesia was provided with 1% lidocaine.

Under direct ultrasound guidance, 4 FNA biopsies were performed of
the inferior left nodule with a 25 gauge needle. Multiple ultrasound
images were saved for procedural documentation purposes. The samples
were prepared and submitted to pathology.

Limited post procedural scanning was negative for hematoma or
additional complication. Dressings were placed. The patient
tolerated the above procedures procedure well without immediate
postprocedural complication.
FINDINGS: Nodule reference number based on prior diagnostic ultrasound: 2

Maximum size: 2.9 cm

Location: Left; Inferior

ACR TI-RADS risk category: TR5 (>/= 7 points)

Reason for biopsy: meets ACR TI-RADS criteria

Ultrasound imaging confirms appropriate placement of the needles
within the thyroid nodule.
IMPRESSION: Technically successful ultrasound guided fine needle aspiration of
inferior left nodule.

## 2018-09-10 DIAGNOSIS — R350 Frequency of micturition: Secondary | ICD-10-CM | POA: Diagnosis not present

## 2018-09-10 DIAGNOSIS — N3946 Mixed incontinence: Secondary | ICD-10-CM | POA: Diagnosis not present

## 2018-09-10 DIAGNOSIS — N3944 Nocturnal enuresis: Secondary | ICD-10-CM | POA: Diagnosis not present

## 2018-09-10 DIAGNOSIS — R351 Nocturia: Secondary | ICD-10-CM | POA: Diagnosis not present

## 2018-09-17 DIAGNOSIS — M9902 Segmental and somatic dysfunction of thoracic region: Secondary | ICD-10-CM | POA: Diagnosis not present

## 2018-09-17 DIAGNOSIS — M9901 Segmental and somatic dysfunction of cervical region: Secondary | ICD-10-CM | POA: Diagnosis not present

## 2018-09-17 DIAGNOSIS — M531 Cervicobrachial syndrome: Secondary | ICD-10-CM | POA: Diagnosis not present

## 2018-09-17 DIAGNOSIS — J45909 Unspecified asthma, uncomplicated: Secondary | ICD-10-CM | POA: Diagnosis not present

## 2018-09-17 DIAGNOSIS — R0609 Other forms of dyspnea: Secondary | ICD-10-CM | POA: Diagnosis not present

## 2018-09-17 DIAGNOSIS — R51 Headache: Secondary | ICD-10-CM | POA: Diagnosis not present

## 2018-09-28 DIAGNOSIS — M1712 Unilateral primary osteoarthritis, left knee: Secondary | ICD-10-CM | POA: Diagnosis not present

## 2018-09-28 DIAGNOSIS — G8929 Other chronic pain: Secondary | ICD-10-CM | POA: Diagnosis not present

## 2018-10-01 DIAGNOSIS — M531 Cervicobrachial syndrome: Secondary | ICD-10-CM | POA: Diagnosis not present

## 2018-10-01 DIAGNOSIS — M9902 Segmental and somatic dysfunction of thoracic region: Secondary | ICD-10-CM | POA: Diagnosis not present

## 2018-10-01 DIAGNOSIS — M9901 Segmental and somatic dysfunction of cervical region: Secondary | ICD-10-CM | POA: Diagnosis not present

## 2018-10-01 DIAGNOSIS — R51 Headache: Secondary | ICD-10-CM | POA: Diagnosis not present

## 2018-10-14 DIAGNOSIS — H2513 Age-related nuclear cataract, bilateral: Secondary | ICD-10-CM | POA: Diagnosis not present

## 2018-10-14 DIAGNOSIS — H04123 Dry eye syndrome of bilateral lacrimal glands: Secondary | ICD-10-CM | POA: Diagnosis not present

## 2018-10-14 DIAGNOSIS — H5203 Hypermetropia, bilateral: Secondary | ICD-10-CM | POA: Diagnosis not present

## 2018-11-12 DIAGNOSIS — M47816 Spondylosis without myelopathy or radiculopathy, lumbar region: Secondary | ICD-10-CM | POA: Diagnosis not present

## 2018-11-12 DIAGNOSIS — G8929 Other chronic pain: Secondary | ICD-10-CM | POA: Diagnosis not present

## 2018-11-12 DIAGNOSIS — S39012A Strain of muscle, fascia and tendon of lower back, initial encounter: Secondary | ICD-10-CM | POA: Diagnosis not present

## 2018-11-12 DIAGNOSIS — M5416 Radiculopathy, lumbar region: Secondary | ICD-10-CM | POA: Diagnosis not present

## 2018-11-12 DIAGNOSIS — M25552 Pain in left hip: Secondary | ICD-10-CM | POA: Diagnosis not present

## 2018-11-19 DIAGNOSIS — M9902 Segmental and somatic dysfunction of thoracic region: Secondary | ICD-10-CM | POA: Diagnosis not present

## 2018-11-19 DIAGNOSIS — M9903 Segmental and somatic dysfunction of lumbar region: Secondary | ICD-10-CM | POA: Diagnosis not present

## 2018-11-19 DIAGNOSIS — R51 Headache: Secondary | ICD-10-CM | POA: Diagnosis not present

## 2018-11-19 DIAGNOSIS — M531 Cervicobrachial syndrome: Secondary | ICD-10-CM | POA: Diagnosis not present

## 2018-11-19 DIAGNOSIS — M9904 Segmental and somatic dysfunction of sacral region: Secondary | ICD-10-CM | POA: Diagnosis not present

## 2018-11-19 DIAGNOSIS — M5386 Other specified dorsopathies, lumbar region: Secondary | ICD-10-CM | POA: Diagnosis not present

## 2018-11-19 DIAGNOSIS — M9901 Segmental and somatic dysfunction of cervical region: Secondary | ICD-10-CM | POA: Diagnosis not present

## 2018-11-24 DIAGNOSIS — M9902 Segmental and somatic dysfunction of thoracic region: Secondary | ICD-10-CM | POA: Diagnosis not present

## 2018-11-24 DIAGNOSIS — M5386 Other specified dorsopathies, lumbar region: Secondary | ICD-10-CM | POA: Diagnosis not present

## 2018-11-24 DIAGNOSIS — M9904 Segmental and somatic dysfunction of sacral region: Secondary | ICD-10-CM | POA: Diagnosis not present

## 2018-11-24 DIAGNOSIS — M9903 Segmental and somatic dysfunction of lumbar region: Secondary | ICD-10-CM | POA: Diagnosis not present

## 2018-12-20 DIAGNOSIS — E041 Nontoxic single thyroid nodule: Secondary | ICD-10-CM | POA: Diagnosis not present

## 2018-12-20 DIAGNOSIS — M25561 Pain in right knee: Secondary | ICD-10-CM | POA: Diagnosis not present

## 2018-12-20 DIAGNOSIS — M170 Bilateral primary osteoarthritis of knee: Secondary | ICD-10-CM | POA: Diagnosis not present

## 2018-12-20 DIAGNOSIS — M25562 Pain in left knee: Secondary | ICD-10-CM | POA: Diagnosis not present

## 2018-12-20 DIAGNOSIS — N3946 Mixed incontinence: Secondary | ICD-10-CM | POA: Diagnosis not present

## 2018-12-20 DIAGNOSIS — G8929 Other chronic pain: Secondary | ICD-10-CM | POA: Diagnosis not present

## 2019-01-07 DIAGNOSIS — M8588 Other specified disorders of bone density and structure, other site: Secondary | ICD-10-CM | POA: Diagnosis not present

## 2019-01-07 DIAGNOSIS — M8580 Other specified disorders of bone density and structure, unspecified site: Secondary | ICD-10-CM | POA: Diagnosis not present

## 2019-01-07 DIAGNOSIS — E2839 Other primary ovarian failure: Secondary | ICD-10-CM | POA: Diagnosis not present

## 2019-01-14 DIAGNOSIS — M9903 Segmental and somatic dysfunction of lumbar region: Secondary | ICD-10-CM | POA: Diagnosis not present

## 2019-01-14 DIAGNOSIS — M9902 Segmental and somatic dysfunction of thoracic region: Secondary | ICD-10-CM | POA: Diagnosis not present

## 2019-01-14 DIAGNOSIS — M5386 Other specified dorsopathies, lumbar region: Secondary | ICD-10-CM | POA: Diagnosis not present

## 2019-01-14 DIAGNOSIS — M9904 Segmental and somatic dysfunction of sacral region: Secondary | ICD-10-CM | POA: Diagnosis not present

## 2019-02-24 DIAGNOSIS — N3946 Mixed incontinence: Secondary | ICD-10-CM | POA: Diagnosis not present

## 2019-02-24 DIAGNOSIS — R350 Frequency of micturition: Secondary | ICD-10-CM | POA: Diagnosis not present

## 2020-02-22 ENCOUNTER — Ambulatory Visit: Payer: BLUE CROSS/BLUE SHIELD | Attending: Internal Medicine

## 2020-02-22 DIAGNOSIS — Z23 Encounter for immunization: Secondary | ICD-10-CM

## 2020-02-22 NOTE — Progress Notes (Signed)
   Covid-19 Vaccination Clinic  Name:  Melanie Roth    MRN: [id number] DOB: May 16, 1956  02/22/2020  Melanie Roth was observed post Covid-19 immunization for 15 minutes without incident. She was provided with Vaccine Information Sheet and instruction to access the V-Safe system.   Melanie Roth was instructed to call 911 with any severe reactions post vaccine: Marland Kitchen Difficulty breathing  . Swelling of face and throat  . A fast heartbeat  . A bad rash all over body  . Dizziness and weakness   Immunizations Administered    Name Date Dose VIS Date Route   Pfizer COVID-19 Vaccine 02/22/2020 10:57 AM 0.3 mL 10/21/2019 Intramuscular   Manufacturer: [place]   Lot: [id number]   [place]: [id number]

## 2020-02-23 ENCOUNTER — Ambulatory Visit: Payer: BLUE CROSS/BLUE SHIELD

## 2020-03-14 ENCOUNTER — Ambulatory Visit: Payer: BLUE CROSS/BLUE SHIELD | Attending: Internal Medicine

## 2020-03-14 DIAGNOSIS — Z23 Encounter for immunization: Secondary | ICD-10-CM

## 2020-03-14 NOTE — Progress Notes (Signed)
   Covid-19 Vaccination Clinic  Name:  Melanie Roth    MRN: [id number] DOB: 1956-01-30  03/14/2020  [name] was observed post Covid-19 immunization for 15 minutes without incident. She was provided with Vaccine Information Sheet and instruction to access the V-Safe system.   [name] was instructed to call 911 with any severe reactions post vaccine: Marland Kitchen Difficulty breathing  . Swelling of face and throat  . A fast heartbeat  . A bad rash all over body  . Dizziness and weakness   Immunizations Administered    Name Date Dose VIS Date Route   Pfizer COVID-19 Vaccine 03/14/2020 10:50 AM 0.3 mL 01/04/2019 Intramuscular   Manufacturer: [place]   Lot: [id number]   Adrian: [id number]

## 2020-12-18 ENCOUNTER — Other Ambulatory Visit (HOSPITAL_COMMUNITY): Payer: Self-pay | Admitting: Internal Medicine

## 2020-12-18 ENCOUNTER — Ambulatory Visit (HOSPITAL_COMMUNITY)
Admission: RE | Admit: 2020-12-18 | Discharge: 2020-12-18 | Disposition: A | Discharge status: Home / Self Care | Payer: BLUE CROSS/BLUE SHIELD | Source: Ambulatory Visit | Attending: Internal Medicine | Admitting: Internal Medicine

## 2020-12-18 ENCOUNTER — Other Ambulatory Visit: Payer: Self-pay

## 2020-12-18 DIAGNOSIS — R0989 Other specified symptoms and signs involving the circulatory and respiratory systems: Secondary | ICD-10-CM | POA: Diagnosis not present

## 2021-01-02 HISTORY — PX: HYSTEROSCOPY WITH D & C: SHX1775

## 2021-07-23 ENCOUNTER — Encounter: Payer: BLUE CROSS/BLUE SHIELD | Admitting: Gastroenterology

## 2021-09-10 DIAGNOSIS — Z171 Estrogen receptor negative status [ER-]: Secondary | ICD-10-CM

## 2021-09-10 HISTORY — DX: Estrogen receptor negative status (ER-): Z17.1

## 2021-10-15 HISTORY — PX: BREAST LUMPECTOMY WITH AXILLARY LYMPH NODE BIOPSY: SHX5593

## 2022-01-10 ENCOUNTER — Other Ambulatory Visit: Payer: Self-pay | Admitting: Internal Medicine

## 2022-01-10 DIAGNOSIS — E785 Hyperlipidemia, unspecified: Secondary | ICD-10-CM

## 2022-01-30 DIAGNOSIS — Z8619 Personal history of other infectious and parasitic diseases: Secondary | ICD-10-CM

## 2022-01-30 HISTORY — DX: Personal history of other infectious and parasitic diseases: Z86.19

## 2022-01-30 HISTORY — PX: COLONOSCOPY WITH ESOPHAGOGASTRODUODENOSCOPY (EGD): SHX5779

## 2022-02-08 DIAGNOSIS — I2510 Atherosclerotic heart disease of native coronary artery without angina pectoris: Secondary | ICD-10-CM

## 2022-02-08 HISTORY — DX: Atherosclerotic heart disease of native coronary artery without angina pectoris: I25.10

## 2022-02-11 ENCOUNTER — Ambulatory Visit
Admission: RE | Admit: 2022-02-11 | Discharge: 2022-02-11 | Disposition: A | Discharge status: Home / Self Care | Payer: No Typology Code available for payment source | Source: Ambulatory Visit | Attending: Internal Medicine | Admitting: Internal Medicine

## 2022-02-11 DIAGNOSIS — E785 Hyperlipidemia, unspecified: Secondary | ICD-10-CM

## 2022-03-20 ENCOUNTER — Ambulatory Visit: Payer: Medicare Other | Admitting: Cardiology

## 2022-04-03 DIAGNOSIS — R931 Abnormal findings on diagnostic imaging of heart and coronary circulation: Secondary | ICD-10-CM | POA: Insufficient documentation

## 2022-04-03 NOTE — Progress Notes (Signed)
Cardiology Office Note   Date:  04/04/2022   ID:  Maurice Small, DOB November 14, 1955, MRN [id number]  PCP:  Crist Infante, MD  Cardiologist:   None Referring:  Crist Infante, MD  Chief Complaint  Patient presents with   Elevated coronary calcium      History of Present Illness: Melanie Roth is a 66 y.o. female who presents for evaluation of an elevated calcium score of 382.  This was 94th percentile.  She has not had any prior cardiac history.  She had this done because of some family history.  She denies any chest pressure, neck or arm discomfort.  She is not noticing any palpitations, presyncope or syncope.  She has no PND or orthopnea.  However, she is a little bit limited in her activities and.  She gets around with a cane because of some knee problems.  She has not had any prior cardiac testing.   She is able to walk around the grocery store.  Past Medical History:  Diagnosis Date   Allergy    Anal fissure    Arthritis    Breast cancer ([place])    Status post lumpectomy and radiation   Hemorrhoids    external   Multiple thyroid nodules     Past Surgical History:  Procedure Laterality Date   BREAST LUMPECTOMY     DILATION AND CURETTAGE OF UTERUS     NSVD     x2   TUBAL LIGATION       Current Outpatient Medications  Medication Sig Dispense Refill   Ascorbic Acid (VITAMIN C) 100 MG CHEW Vitamin C     baclofen (LIORESAL) 10 MG tablet Take by mouth.     Biotin 2500 MCG CAPS Take by mouth.     cholecalciferol (VITAMIN D) 1000 units tablet Vitamin D     diazepam (VALIUM) 5 MG tablet Take 5 mg by mouth every 6 (six) hours as needed for muscle spasms.      ibuprofen (ADVIL,MOTRIN) 200 MG tablet Take 200 mg by mouth.     meloxicam (MOBIC) 15 MG tablet Take 15 mg by mouth as needed for pain.     Multiple Vitamins-Minerals (MULTIVITAMIN ADULT PO) Take by mouth.     nitroGLYCERIN (NITROSTAT) 0.4 MG SL tablet Place 1 tablet (0.4 mg total) under the tongue  every 5 (five) minutes as needed for chest pain. 25 tablet 3   promethazine (PHENERGAN) 25 MG tablet Take 1 tablet (25 mg total) by mouth every 6 (six) hours as needed for nausea or vomiting. 30 tablet 0   rosuvastatin (CRESTOR) 20 MG tablet Take 20 mg by mouth at bedtime.     triamcinolone ointment (KENALOG) 0.1 % triamcinolone acetonide 0.1 % topical ointment  APPLY bid to affected area behind left ear     cetirizine (ZYRTEC) 10 MG tablet Take 10 mg by mouth daily. (Patient not taking: Reported on 04/04/2022)     diclofenac sodium (VOLTAREN) 1 % GEL Apply to affected area BID PRN (Patient not taking: Reported on 04/04/2022)     DOCOSAHEXAENOIC ACID PO Take by mouth. (Patient not taking: Reported on 04/04/2022)     Current Facility-Administered Medications  Medication Dose Route Frequency Provider Last Rate Last Admin   0.9 %  sodium chloride infusion  500 mL Intravenous Once Armbruster, Carlota Raspberry, MD        Allergies:   Oxycodone-acetaminophen and Codeine    Social History:  The patient  reports that she quit smoking about  20 years ago. Her smoking use included cigarettes. She has a 2.00 pack-year smoking history. She has never used smokeless tobacco. She reports current alcohol use. She reports that she does not use drugs.   Family History:  The patient's family history includes CVA in her mother; Cancer - Other in her maternal aunt; Colon cancer (age of onset: 2) in her brother; Colon polyps in her brother, brother, and brother; Heart disease (age of onset: 69) in her mother; Liver cancer in her maternal uncle; Ovarian cancer in her maternal aunt.    ROS:  Please see the history of present illness.   Otherwise, review of systems are positive for vaginal bleeding.   All other systems are reviewed and negative.    PHYSICAL EXAM: VS:  BP 120/76 (BP Location: Left Arm)   Pulse 94   Ht 5' 1.5" (1.562 m)   Wt 197 lb 12.8 oz (89.7 kg)   SpO2 98%   BMI 36.77 kg/m  , BMI Body mass index is  36.77 kg/m. GENERAL:  Well appearing HEENT:  Pupils equal round and reactive, fundi not visualized, oral mucosa unremarkable NECK:  No jugular venous distention, waveform within normal limits, carotid upstroke brisk and symmetric, no bruits, no thyromegaly LYMPHATICS:  No cervical, inguinal adenopathy LUNGS:  Clear to auscultation bilaterally BACK:  No CVA tenderness CHEST:  Unremarkable HEART:  PMI not displaced or sustained,S1 and S2 within normal limits, no S3, no S4, no clicks, no rubs, no murmurs ABD:  Flat, positive bowel sounds normal in frequency in pitch, no bruits, no rebound, no guarding, no midline pulsatile mass, no hepatomegaly, no splenomegaly EXT:  2 plus pulses throughout, no edema, no cyanosis no clubbing SKIN:  No rashes no nodules NEURO:  Cranial nerves II through XII grossly intact, motor grossly intact throughout PSYCH:  Cognitively intact, oriented to person place and time    EKG:  EKG is ordered today. The ekg ordered today demonstrates sinus rhythm, rate 94, axis within normal limits, intervals within normal limits, no acute ST-T wave changes.   Recent Labs: No results found for requested labs within last 8760 hours.    Lipid Panel No results found for: CHOL, TRIG, HDL, CHOLHDL, VLDL, LDLCALC, LDLDIRECT    Wt Readings from Last 3 Encounters:  04/04/22 197 lb 12.8 oz (89.7 kg)  12/08/17 192 lb (87.1 kg)  11/24/17 192 lb (87.1 kg)      Other studies Reviewed: Additional studies/ records that were reviewed today include: Primary care records, calcium score. Review of the above records demonstrates:  Please see elsewhere in the note.     ASSESSMENT AND PLAN:  Elevated coronary calcium : The patient I had a long discussion about her elevated calcium score.  She has absolutely no symptoms.  She would not be able to walk on a treadmill for an exercise test.  At this point I do not think more extensive testing is indicated.  However, she needs to  participate aggressively in primary risk reduction we talked about this at length.  I did suggest that she should consider taking an aspirin.  I cannot calculate her Framingham risk score or her MESA score since I do not have her lipids that I suspect is above 10.  She is going to wait until she has some vaginal bleeding that she is experiencing evaluated before considering starting 81 mg.  Dyslipidemia: The patient reports that she has had some elevated lipids though I do not have these and do not  have access.  I would however suggest that the goal should be an LDL at least less than 100 and so I do not disagree with the Crestor that was prescribed.  She will start this and can follow-up with her primary provider to get a lipid profile in about 3 months.   Current medicines are reviewed at length with the patient today.  The patient does not have concerns regarding medicines.  The following changes have been made:  no change  Labs/ tests ordered today include:   Orders Placed This Encounter  Procedures   EKG 12-Lead     Disposition:   FU with me in 1 year.   Signed, Minus Breeding, MD  04/04/2022 1:26 PM    [facility]

## 2022-04-04 ENCOUNTER — Ambulatory Visit: Payer: Medicare Other | Admitting: Cardiology

## 2022-04-04 ENCOUNTER — Telehealth: Payer: Self-pay

## 2022-04-04 ENCOUNTER — Encounter: Payer: Self-pay | Admitting: Cardiology

## 2022-04-04 VITALS — BP 120/76 | HR 94 | Ht 61.5 in | Wt 197.8 lb

## 2022-04-04 DIAGNOSIS — R931 Abnormal findings on diagnostic imaging of heart and coronary circulation: Secondary | ICD-10-CM

## 2022-04-04 MED ORDER — NITROGLYCERIN 0.4 MG SL SUBL: 0.4000 mg | SUBLINGUAL_TABLET | SUBLINGUAL | 3 refills | Status: AC | PRN

## 2022-04-04 NOTE — Telephone Encounter (Signed)
Called to see if pt was coming to her appt. She states " I will be there"

## 2022-04-04 NOTE — Patient Instructions (Addendum)
Medication Instructions:  Your physician has recommended you make the following change in your medication:  START: Nitroglycerin 0.4 mg take one tablet by mouth every 5 minutes (up to three times) as needed for chest pain  *If you need a refill on your cardiac medications before your next appointment, please call your pharmacy*   Lab Work: None If you have labs (blood work) drawn today and your tests are completely normal, you will receive your results only by: [place] (if you have MyChart) OR A paper copy in the mail If you have any lab test that is abnormal or we need to change your treatment, we will call you to review the results.   Testing/Procedures: None   Follow-Up: At [facility], you and your health needs are our priority.  As part of our continuing mission to provide you with exceptional heart care, we have created designated Provider Care Teams.  These Care Teams include your primary Cardiologist (physician) and Advanced Practice Providers (APPs -  Physician Assistants and Nurse Practitioners) who all work together to provide you with the care you need, when you need it.  We recommend signing up for the patient portal called "MyChart".  Sign up information is provided on this After Visit Summary.  MyChart is used to connect with patients for Virtual Visits (Telemedicine).  Patients are able to view lab/test results, encounter notes, upcoming appointments, etc.  Non-urgent messages can be sent to your provider as well.   To learn more about what you can do with MyChart, go to [email or web address].    Your next appointment:   1 year(s)  The format for your next appointment:   In Person  Provider:   Minus Breeding, MD   Other Instructions   Important Information About Sugar

## 2022-04-17 ENCOUNTER — Telehealth: Payer: Self-pay | Admitting: Cardiology

## 2022-04-17 NOTE — Telephone Encounter (Signed)
Patient reported that over the past 2-3 days, she has experienced muscle tightening in the back of her neck and thighs, as well as dry patches on her cheeks that resolved with application of olive oil. She did not take rosuvastatin today and feels better. She wants to stop taking it until she hears back from [place]. Please advise on rosuvastatin.Melanie Roth

## 2022-04-17 NOTE — Telephone Encounter (Signed)
Pt c/o medication issue:  1. Name of Medication: rosuvastatin (CRESTOR) 20 MG tablet  2. How are you currently taking this medication (dosage and times per day)? As prescribed   3. Are you having a reaction (difficulty breathing--STAT)? Yes   4. What is your medication issue? Patient is calling stating she began having issues 2-3 days ago with the muscles in the back of her neck tightening up, as well as the front of her thighs. She also complains of dry patches on her cheeks that she believes is being caused by this medication. Please advise.

## 2022-04-22 NOTE — Telephone Encounter (Signed)
Called patient to inform her to to stay off rosuvastatin until patches and muscle pain resolve. Patient then expressed that she has unbearable knee pain 8/10 "squeezing and heavy" feeling, not the same as her arthritic pain. She also reports shoulder pain, left more than right. She goes to chiropractor to get neck pain relief. She takes Mobic for the pain, but not all the time because, "I don't want to hurt my kidneys."  She is asking for a milder statin with less side effects. Also, she wants to know if she should take ASA 81 mg because she has not bleeding (per conversation with Dr. Percival Spanish at May appointment.)

## 2022-04-22 NOTE — Telephone Encounter (Signed)
Patient will begin taking ASA 81 mg. Recent blood work form Dr. Radene Journey will be faxed over to [place]. No lipids on it. Unable to reach medical records at PCP. I can order lipid panel if you would like.

## 2022-04-23 ENCOUNTER — Other Ambulatory Visit: Payer: Self-pay

## 2022-04-23 DIAGNOSIS — R931 Abnormal findings on diagnostic imaging of heart and coronary circulation: Secondary | ICD-10-CM

## 2022-04-23 LAB — LIPID PANEL
Chol/HDL Ratio: 3.2 ratio (ref 0.0–4.4)
Cholesterol, Total: 178 mg/dL (ref 100–199)
HDL: 55 mg/dL (ref >39)
LDL Chol Calc (NIH): 95 mg/dL (ref 0–99)
Triglycerides: 161 mg/dL — ABNORMAL HIGH (ref 0–149)
VLDL Cholesterol Cal: 28 mg/dL (ref 5–40)

## 2022-04-25 ENCOUNTER — Telehealth: Payer: Self-pay | Admitting: *Deleted

## 2022-04-25 DIAGNOSIS — R931 Abnormal findings on diagnostic imaging of heart and coronary circulation: Secondary | ICD-10-CM

## 2022-04-25 MED ORDER — PRAVASTATIN SODIUM 40 MG PO TABS: 40.0000 mg | ORAL_TABLET | Freq: Every evening | ORAL | 11 refills | Status: DC

## 2022-04-25 NOTE — Telephone Encounter (Signed)
pt aware of results  New script sent to the pharmacy  Lab orders mailed to the pt  

## 2022-04-25 NOTE — Telephone Encounter (Signed)
-----   Message from Minus Breeding, MD sent at 04/25/2022  8:28 AM EDT ----- We can stop the Crestor and try pravastatin 40 mg p.o. nightly and check a lipid profile in 10 weeks.  Call [name] with the results and send results to Crist Infante, MD

## 2022-05-06 ENCOUNTER — Telehealth: Payer: Self-pay | Admitting: Cardiology

## 2022-05-06 NOTE — Telephone Encounter (Signed)
-  Pt called reporting since starting pravastatin she's developed a rash on her chest and stomach -Pt also report pain in both knees that she feel is related to medication -Pt also stated she developed an aching pain in her chest that radiated to left arm and area where she had breast surgery last night that lasted an hour -Pt denies symptoms currently but report she just feel tired  Will forward to MD for recommendations.

## 2022-05-06 NOTE — Telephone Encounter (Signed)
Pt c/o medication issue:  1. Name of Medication:    2. How are you currently taking this medication (dosage and times per day)?    3. Are you having a reaction (difficulty breathing--STAT)? no  4. What is your medication issue? Patient states she has a rash all over her chest from the medication. She states its the cholesterol medication that she was just put on. And that he knees still hurt. Please advise

## 2022-06-13 ENCOUNTER — Other Ambulatory Visit (HOSPITAL_COMMUNITY): Payer: Self-pay | Admitting: *Deleted

## 2022-06-16 ENCOUNTER — Ambulatory Visit (HOSPITAL_COMMUNITY)
Admission: RE | Admit: 2022-06-16 | Discharge: 2022-06-16 | Disposition: A | Discharge status: Home / Self Care | Payer: Medicare Other | Source: Ambulatory Visit | Attending: Internal Medicine | Admitting: Internal Medicine

## 2022-06-16 DIAGNOSIS — E785 Hyperlipidemia, unspecified: Secondary | ICD-10-CM | POA: Insufficient documentation

## 2022-06-16 MED ORDER — INCLISIRAN SODIUM 284 MG/1.5ML ~~LOC~~ SOSY
284.0000 mg | PREFILLED_SYRINGE | Freq: Once | SUBCUTANEOUS | Status: AC
Start: 2022-06-16 — End: 2022-06-16
  Administered 2022-06-16: 284 mg via SUBCUTANEOUS

## 2022-06-16 MED ORDER — INCLISIRAN SODIUM 284 MG/1.5ML ~~LOC~~ SOSY
PREFILLED_SYRINGE | SUBCUTANEOUS | Status: AC
  Filled 2022-06-16: qty 1.5

## 2022-08-28 ENCOUNTER — Encounter: Payer: Self-pay | Admitting: *Deleted

## 2022-09-15 ENCOUNTER — Other Ambulatory Visit (HOSPITAL_COMMUNITY): Payer: Self-pay

## 2022-09-16 ENCOUNTER — Ambulatory Visit (HOSPITAL_COMMUNITY)
Admission: RE | Admit: 2022-09-16 | Discharge: 2022-09-16 | Disposition: A | Discharge status: Home / Self Care | Payer: Medicare Other | Source: Ambulatory Visit | Attending: Internal Medicine | Admitting: Internal Medicine

## 2022-09-16 DIAGNOSIS — E785 Hyperlipidemia, unspecified: Secondary | ICD-10-CM | POA: Insufficient documentation

## 2022-09-16 MED ORDER — INCLISIRAN SODIUM 284 MG/1.5ML ~~LOC~~ SOSY
284.0000 mg | PREFILLED_SYRINGE | Freq: Once | SUBCUTANEOUS | Status: AC
Start: 2022-09-16 — End: 2022-09-16
  Administered 2022-09-16: 284 mg via SUBCUTANEOUS

## 2022-09-16 MED ORDER — INCLISIRAN SODIUM 284 MG/1.5ML ~~LOC~~ SOSY
PREFILLED_SYRINGE | SUBCUTANEOUS | Status: AC
  Filled 2022-09-16: qty 1.5

## 2022-11-26 ENCOUNTER — Telehealth: Payer: Self-pay

## 2022-11-26 NOTE — Telephone Encounter (Signed)
Spoke with patient who is agreeable to do a tele visit on 1/25 at 9:40 am. Med rec and consent have been done.

## 2022-11-26 NOTE — Telephone Encounter (Signed)
   Name: Melanie Roth  DOB: 1956-08-22  MRN: [id number]  Primary Cardiologist: None   Preoperative team, please contact this patient and set up a phone call appointment for further preoperative risk assessment. Please obtain consent and complete medication review. Thank you for your help.  I confirm that guidance regarding antiplatelet and oral anticoagulation therapy has been completed and, if necessary, noted below.  New medications requested to be held.   Mable Fill, Marissa Nestle, NP 11/26/2022, 2:51 PM [place]

## 2022-11-26 NOTE — Telephone Encounter (Signed)
  Patient Consent for Virtual Visit        Melanie Roth has provided verbal consent on 11/26/2022 for a virtual visit (video or telephone).   CONSENT FOR VIRTUAL VISIT FOR:  Melanie Roth  By participating in this virtual visit I agree to the following:  I hereby voluntarily request, consent and authorize [facility] and its employed or contracted physicians, physician assistants, nurse practitioners or other licensed health care professionals (the Practitioner), to provide me with telemedicine health care services (the "Services") as deemed necessary by the treating Practitioner. I acknowledge and consent to receive the Services by the Practitioner via telemedicine. I understand that the telemedicine visit will involve communicating with the Practitioner through live audiovisual communication technology and the disclosure of certain medical information by electronic transmission. I acknowledge that I have been given the opportunity to request an in-person assessment or other available alternative prior to the telemedicine visit and am voluntarily participating in the telemedicine visit.  I understand that I have the right to withhold or withdraw my consent to the use of telemedicine in the course of my care at any time, without affecting my right to future care or treatment, and that the Practitioner or I may terminate the telemedicine visit at any time. I understand that I have the right to inspect all information obtained and/or recorded in the course of the telemedicine visit and may receive copies of available information for a reasonable fee.  I understand that some of the potential risks of receiving the Services via telemedicine include:  Delay or interruption in medical evaluation due to technological equipment failure or disruption; Information transmitted may not be sufficient (e.g. poor resolution of images) to allow for appropriate medical decision making  by the Practitioner; and/or  In rare instances, security protocols could fail, causing a breach of personal health information.  Furthermore, I acknowledge that it is my responsibility to provide information about my medical history, conditions and care that is complete and accurate to the best of my ability. I acknowledge that Practitioner's advice, recommendations, and/or decision may be based on factors not within their control, such as incomplete or inaccurate data provided by me or distortions of diagnostic images or specimens that may result from electronic transmissions. I understand that the practice of medicine is not an exact science and that Practitioner makes no warranties or guarantees regarding treatment outcomes. I acknowledge that a copy of this consent can be made available to me via my patient portal ([facility]), or I can request a printed copy by calling the office of [facility].    I understand that my insurance will be billed for this visit.   I have read or had this consent read to me. I understand the contents of this consent, which adequately explains the benefits and risks of the Services being provided via telemedicine.  I have been provided ample opportunity to ask questions regarding this consent and the Services and have had my questions answered to my satisfaction. I give my informed consent for the services to be provided through the use of telemedicine in my medical care

## 2022-11-26 NOTE — Telephone Encounter (Signed)
   Pre-operative Risk Assessment    Patient Name: Melanie Roth  DOB: 18-Sep-1956 MRN: [id number]      Request for Surgical Clearance    Procedure:   Hysteroscopy D&C possible Myosure  Date of Surgery:  Clearance 12/10/22                                 Surgeon:  Dr. Everlene Farrier  Surgeon's Group or Practice Name:  [facility]  Phone number:  [phone] Fax number:  [phone]   Type of Clearance Requested:   - Medical    Type of Anesthesia:   Choice    Additional requests/questions:    SignedJacqulynn Cadet   11/26/2022, 2:28 PM

## 2022-12-04 ENCOUNTER — Ambulatory Visit: Payer: Medicare Other | Attending: Internal Medicine | Admitting: Physician Assistant

## 2022-12-04 DIAGNOSIS — Z01810 Encounter for preprocedural cardiovascular examination: Secondary | ICD-10-CM

## 2022-12-04 NOTE — Progress Notes (Signed)
Virtual Visit via Telephone Note   Because of Melanie Roth's co-morbid illnesses, she is at least at moderate risk for complications without adequate follow up.  This format is felt to be most appropriate for this patient at this time.  The patient did not have access to video technology/had technical difficulties with video requiring transitioning to audio format only (telephone).  All issues noted in this document were discussed and addressed.  No physical exam could be performed with this format.  Please refer to the patient's chart for her consent to telehealth for [facility].  Evaluation Performed:  Preoperative cardiovascular risk assessment _____________   Date:  12/04/2022   Patient ID:  Melanie Roth, DOB June 27, 1956, MRN [id number] Patient Location:  Home Provider location:   Office  Primary Care Provider:  Crist Infante, MD Primary Cardiologist:  None  Chief Complaint / Patient Profile   67 y.o. y/o female with a h/o elevated calcium score, breast cancer who is pending Hysteroscopy D and C possible Myosure and presents today for telephonic preoperative cardiovascular risk assessment.  History of Present Illness    [name] is a 67 y.o. female who presents via audio/video conferencing for a telehealth visit today.  Pt was last seen in cardiology clinic on 04/04/2022 by Dr. Percival Spanish.    Patient tells me today that her surgery has been rescheduled for March 8th and she would like to do her telephone visit closer to that time. Past Medical History    Past Medical History:  Diagnosis Date   Allergy    Anal fissure    Arthritis    Breast cancer ([place])    Status post lumpectomy and radiation   Hemorrhoids    external   Multiple thyroid nodules    Past Surgical History:  Procedure Laterality Date   BREAST LUMPECTOMY     DILATION AND CURETTAGE OF UTERUS     NSVD     x2   TUBAL LIGATION      Allergies  Allergies   Allergen Reactions   Oxycodone-Acetaminophen Nausea And Vomiting and Nausea Only    PATIENT STATES SHE WAS OUT OF IT PATIENT STATES SHE WAS OUT OF IT PATIENT STATES SHE WAS OUT OF IT PATIENT STATES SHE WAS OUT OF IT    Codeine Nausea Only    Home Medications    Prior to Admission medications   Medication Sig Start Date End Date Taking? Authorizing Provider  Ascorbic Acid (VITAMIN C) 100 MG CHEW Vitamin C    [provider]  baclofen (LIORESAL) 10 MG tablet Take by mouth. 07/19/17   [provider]  Biotin 2500 MCG CAPS Take by mouth.    [provider]  cetirizine (ZYRTEC) 10 MG tablet Take 10 mg by mouth daily.    [provider]  cholecalciferol (VITAMIN D) 1000 units tablet Vitamin D    [provider]  diazepam (VALIUM) 5 MG tablet Take 5 mg by mouth every 6 (six) hours as needed for muscle spasms.     [provider]  diclofenac sodium (VOLTAREN) 1 % GEL  01/07/17   [provider]  DOCOSAHEXAENOIC ACID PO Take by mouth.    [provider]  ibuprofen (ADVIL,MOTRIN) 200 MG tablet Take 200 mg by mouth.    [provider]  meloxicam (MOBIC) 15 MG tablet Take 15 mg by mouth as needed for pain.    [provider]  Multiple Vitamins-Minerals (MULTIVITAMIN ADULT PO) Take by mouth.  [provider]  nitroGLYCERIN (NITROSTAT) 0.4 MG SL tablet Place 1 tablet (0.4 mg total) under the tongue every 5 (five) minutes as needed for chest pain. 04/04/22 07/03/22  Minus Breeding, MD  pravastatin (PRAVACHOL) 40 MG tablet Take 1 tablet (40 mg total) by mouth every evening. 04/25/22   Minus Breeding, MD  promethazine (PHENERGAN) 25 MG tablet Take 1 tablet (25 mg total) by mouth every 6 (six) hours as needed for nausea or vomiting. 08/22/14   Linton Flemings, MD  triamcinolone ointment (KENALOG) 0.1 % triamcinolone acetonide 0.1 % topical ointment  APPLY bid to affected area behind left ear    [provider]    Physical Exam    Vital Signs:  Melanie Roth does not have vital signs available for review today.  Given telephonic nature of communication, physical exam is limited. AAOx3. NAD. Normal affect.  Speech and respirations are unlabored.  Accessory Clinical Findings    None  Assessment & Plan    1.  Preoperative Cardiovascular Risk Assessment:  Surgery has been post-poned until March 8th. She would like to speak with [place] closer to that date.  A copy of this note will be routed to requesting surgeon.  Time:   Today, I have spent 5 minutes with the patient with telehealth technology discussing medical history, symptoms, and management plan.     Elgie Collard, PA-C  12/04/2022, 8:45 AM

## 2022-12-05 NOTE — Telephone Encounter (Signed)
I will fax over [place] to the surgeon office to please see notes below. Pt did not complete her tele visit 12/04/22. Pt was wants a call closer to her procedure.    Assessment & Plan    1.  Preoperative Cardiovascular Risk Assessment:   Surgery has been post-poned until March 8th. She would like to speak with [place] closer to that date.   A copy of this note will be routed to requesting surgeon.

## 2022-12-05 NOTE — Telephone Encounter (Signed)
Melanie Roth with [facility] calling to follow up on clearance.

## 2022-12-09 DIAGNOSIS — N939 Abnormal uterine and vaginal bleeding, unspecified: Secondary | ICD-10-CM

## 2023-01-02 NOTE — Telephone Encounter (Signed)
S/w the pt today to reschedule her tele pre op appt. Pt told pre op APP in 11/2022 she wanted to do her tele visit closer to her surgery.pt has agreed to tele visit 01/08/23 @ 2 pm. Med rec and consent were done previously. I will update all parties involved.

## 2023-01-08 ENCOUNTER — Ambulatory Visit: Payer: Medicaid Other | Attending: Internal Medicine

## 2023-01-08 DIAGNOSIS — Z01810 Encounter for preprocedural cardiovascular examination: Secondary | ICD-10-CM

## 2023-01-08 NOTE — Progress Notes (Signed)
Virtual Visit via Telephone Note   Because of Melanie Roth's co-morbid illnesses, she is at least at moderate risk for complications without adequate follow up.  This format is felt to be most appropriate for this patient at this time.  The patient did not have access to video technology/had technical difficulties with video requiring transitioning to audio format only (telephone).  All issues noted in this document were discussed and addressed.  No physical exam could be performed with this format.  Please refer to the patient's chart for her consent to telehealth for [facility].  Evaluation Performed:  Preoperative cardiovascular risk assessment _____________   Date:  01/08/2023   Patient ID:  Melanie Roth, DOB Jun 03, 1956, MRN [id number] Patient Location:  Home Provider location:   Office  Primary Care Provider:  Crist Infante, MD Primary Cardiologist:  None  Chief Complaint / Patient Profile   67 y.o. y/o female with a h/o elevated calcium score who is pending D and C and presents today for telephonic preoperative cardiovascular risk assessment.  History of Present Illness    Melanie Roth is a 67 y.o. female  with a past medical history of elevated calcium score who presents via audio/video conferencing for a telehealth visit today.  Pt was last seen in cardiology clinic on 5/23  by Dr. Percival Spanish.  At that time Melanie Roth was doing well.  The patient is now pending procedure as outlined above. Since her last visit, she has been doing well from a heart standpoint. She has chronic knee pain and needs a knee replacement. Because of this, she cannot walk 1-2 blocks. She tells me her knee is bone on bone and she usually feels much better after an injection (she is due for one next month). She is on mobic for her arthritis pain and valium for her neck pain. She also takes muscle relaxers as needed. She scored a 3.63 on the DASI. This  is less than the minimum requirement so, I will send a message to Dr. Percival Spanish to see if she would require further testing before clearance can be granted.   No medications indicated as needing held.  Past Medical History    Past Medical History:  Diagnosis Date   Allergy    Anal fissure    Arthritis    Breast cancer ([place])    Status post lumpectomy and radiation   Hemorrhoids    external   Multiple thyroid nodules    Past Surgical History:  Procedure Laterality Date   BREAST LUMPECTOMY     DILATION AND CURETTAGE OF UTERUS     NSVD     x2   TUBAL LIGATION      Allergies  Allergies  Allergen Reactions   Oxycodone-Acetaminophen Nausea And Vomiting and Nausea Only    PATIENT STATES SHE WAS OUT OF IT PATIENT STATES SHE WAS OUT OF IT PATIENT STATES SHE WAS OUT OF IT PATIENT STATES SHE WAS OUT OF IT    Codeine Nausea Only    Home Medications    Prior to Admission medications   Medication Sig Start Date End Date Taking? Authorizing Provider  Ascorbic Acid (VITAMIN C) 100 MG CHEW Vitamin C    [provider]  baclofen (LIORESAL) 10 MG tablet Take by mouth. 07/19/17   [provider]  Biotin 2500 MCG CAPS Take by mouth.    [provider]  cetirizine (ZYRTEC) 10 MG tablet Take 10 mg by mouth daily.    [provider]  cholecalciferol (VITAMIN D) 1000 units tablet Vitamin D    [provider]  diazepam (VALIUM) 5 MG tablet Take 5 mg by mouth every 6 (six) hours as needed for muscle spasms.     [provider]  diclofenac sodium (VOLTAREN) 1 % GEL  01/07/17   [provider]  DOCOSAHEXAENOIC ACID PO Take by mouth.    [provider]  ibuprofen (ADVIL,MOTRIN) 200 MG tablet Take 200 mg by mouth.    [provider]  meloxicam (MOBIC) 15 MG tablet Take 15 mg by mouth as needed for pain.    [provider]  Multiple Vitamins-Minerals (MULTIVITAMIN ADULT PO) Take by mouth.    [provider]  nitroGLYCERIN (NITROSTAT) 0.4 MG SL tablet Place 1 tablet (0.4 mg total) under the tongue every 5 (five) minutes as needed for chest pain. 04/04/22 07/03/22  Minus Breeding, MD  pravastatin (PRAVACHOL) 40 MG tablet Take 1 tablet (40 mg total) by mouth every evening. 04/25/22   Minus Breeding, MD  promethazine (PHENERGAN) 25 MG tablet Take 1 tablet (25 mg total) by mouth every 6 (six) hours as needed for nausea or vomiting. 08/22/14   Linton Flemings, MD  triamcinolone ointment (KENALOG) 0.1 % triamcinolone acetonide 0.1 % topical ointment  APPLY bid to affected area behind left ear    [provider]    Physical Exam    Vital Signs:  Melanie Roth does not have vital signs available for review today.  Given telephonic nature of communication, physical exam is limited. AAOx3. NAD. Normal affect.  Speech and respirations are unlabored.  Accessory Clinical Findings    None  Assessment & Plan    1.  Preoperative Cardiovascular Risk Assessment:   Ms. Guire perioperative risk of a major cardiac event is 0.4% according to the Revised Cardiac Risk Index (RCRI).  Therefore, she is at low risk for perioperative complications.   Her functional capacity is poor at 3.63 METs according to the [place] Activity Status Index (DASI). Recommendations: According to ACC/AHA guidelines, no further cardiovascular testing needed.  The patient may proceed to surgery at acceptable risk.     The patient was advised that if she develops new symptoms prior to surgery to contact our office to arrange for a follow-up visit, and she verbalized understanding.  A copy of this note will be routed to requesting surgeon.  Time:   Today, I have spent 10 minutes with the patient with telehealth technology discussing medical history, symptoms, and management plan.     Elgie Collard, PA-C  01/08/2023, 3:54 PM

## 2023-01-14 ENCOUNTER — Encounter (HOSPITAL_BASED_OUTPATIENT_CLINIC_OR_DEPARTMENT_OTHER): Payer: Self-pay | Admitting: Obstetrics and Gynecology

## 2023-01-14 NOTE — Progress Notes (Addendum)
Addendum:   Chart reviewed w/ anesthesia, Dr Gloris Manchester.  Pt stated she has small throat and mouth. Had severe throat pain after surgery 01-02-2021 done @ [facility] and told had problem with last surgery @ [facility] 10-15-2021.  Asked pt was she given or mailed a difficult intubation letter , pt stated no.  Had [facility] review previous anesthesia records, stated pt not candidate at ambulatory surgery center pt need to be done main OR.78. Called and spoke w/ Stanton Kidney, [place] scheduler for Dr Gaetano Net, informed her pt will need to be moved to main OR and why.   Spoke w/ via phone for pre-op interview---  pt Lab needs dos----   cbc, t&s, hiv            Lab results------ no COVID test -----patient states asymptomatic no test needed Arrive at ------- 0530 on 01-16-2023 NPO after MN NO Solid Food.  Clear liquids from MN until--- 0430 Med rec completed Medications to take morning of surgery ----- protonix Diabetic medication ----- n/a Patient instructed no nail polish to be worn day of surgery Patient instructed to bring photo id and insurance card day of surgery Patient aware to have Driver (ride ) / caregiver    for 24 hours after surgery -- sig other, sam Patient Special Instructions ----- n/a Pre-Op special Istructions -----  pt states she difficult IV stick.  Told she has small veins. Last IV was 10-15-2021 @ [facility] w/ breast lumpectomy.  Also, stated she has a small mouth and throat and does not like the tube , only wants medication injected no tube, had severe throat pain had to take prednisone Patient verbalized understanding of instructions that were given at this phone interview. Patient denies shortness of breath, chest pain, fever, cough at this phone interview.

## 2023-01-15 ENCOUNTER — Encounter (HOSPITAL_COMMUNITY): Payer: Self-pay | Admitting: Obstetrics and Gynecology

## 2023-01-15 NOTE — Progress Notes (Signed)
PCP - Dr. Joylene Draft Cardiologist - Dr. Percival Spanish For cholesterol EKG - 04/04/22 Chest x-ray -  ECHO - Denies Cardiac Cath - Denies CPAP - Denies DM- Denies COVID TEST- N/A  Anesthesia review: Jeneen Rinks, [place] is aware notes from cards in chart  -------------  SDW INSTRUCTIONS:  Your procedure is scheduled on March 8th . Please report to [facility] Main Entrance "A" at 10:30 A.M., and check in at the Admitting office. Call this number if you have problems the morning of surgery: [phone]   Remember: Do not eat or drinking after midnight the night before your surgery    Medications to take morning of surgery with a sip of water include: Claritin and Protonix IF needed: albuterol, flonase, valium, nitro  As of today, STOP taking any Aspirin (unless otherwise instructed by your surgeon), Aleve, Naproxen, Ibuprofen, Motrin, Advil, Goody's, BC's, all herbal medications, fish oil, and all vitamins.    The Morning of Surgery Do not wear jewelry, make-up or nail polish. Do not wear lotions, powders, or perfumes/colognes, or deodorant Do not bring valuables to the hospital. [facility] is not responsible for any belongings or valuables.  If you are a smoker, DO NOT Smoke 24 hours prior to surgery  If you wear a CPAP at night please bring your mask the morning of surgery   Remember that you must have someone to transport you home after your surgery, and remain with you for 24 hours if you are discharged the same day.  Please bring cases for contacts, glasses, hearing aids, dentures or bridgework because it cannot be worn into surgery.   Patients discharged the day of surgery will not be allowed to drive home.   Please shower the NIGHT BEFORE/MORNING OF SURGERY (use antibacterial soap like DIAL soap if possible). Wear comfortable clothes the morning of surgery. Oral Hygiene is also important to reduce your risk of infection.  Remember - BRUSH YOUR TEETH THE MORNING OF SURGERY WITH YOUR  REGULAR TOOTHPASTE  Patient denies shortness of breath, fever, cough and chest pain.   Patient denies any respiratory infection, PNA, flu, or cold in the past two months.

## 2023-01-16 ENCOUNTER — Other Ambulatory Visit: Payer: Self-pay

## 2023-01-16 ENCOUNTER — Encounter (HOSPITAL_COMMUNITY)
Admission: RE | Disposition: A | Discharge status: Home / Self Care | Payer: Self-pay | Source: Ambulatory Visit | Attending: Obstetrics and Gynecology

## 2023-01-16 ENCOUNTER — Ambulatory Visit (HOSPITAL_COMMUNITY): Payer: 59 | Admitting: Physician Assistant

## 2023-01-16 ENCOUNTER — Ambulatory Visit (HOSPITAL_COMMUNITY)
Admission: RE | Admit: 2023-01-16 | Discharge: 2023-01-16 | Disposition: A | Discharge status: Home / Self Care | Payer: 59 | Source: Ambulatory Visit | Attending: Obstetrics and Gynecology | Admitting: Obstetrics and Gynecology

## 2023-01-16 ENCOUNTER — Ambulatory Visit (HOSPITAL_BASED_OUTPATIENT_CLINIC_OR_DEPARTMENT_OTHER): Payer: 59 | Admitting: Physician Assistant

## 2023-01-16 DIAGNOSIS — M1990 Unspecified osteoarthritis, unspecified site: Secondary | ICD-10-CM | POA: Insufficient documentation

## 2023-01-16 DIAGNOSIS — N840 Polyp of corpus uteri: Secondary | ICD-10-CM | POA: Insufficient documentation

## 2023-01-16 DIAGNOSIS — Z87891 Personal history of nicotine dependence: Secondary | ICD-10-CM | POA: Diagnosis not present

## 2023-01-16 DIAGNOSIS — N939 Abnormal uterine and vaginal bleeding, unspecified: Secondary | ICD-10-CM | POA: Diagnosis not present

## 2023-01-16 DIAGNOSIS — I2510 Atherosclerotic heart disease of native coronary artery without angina pectoris: Secondary | ICD-10-CM | POA: Insufficient documentation

## 2023-01-16 DIAGNOSIS — Z01818 Encounter for other preprocedural examination: Secondary | ICD-10-CM

## 2023-01-16 HISTORY — PX: DILATATION & CURETTAGE/HYSTEROSCOPY WITH MYOSURE: SHX6511

## 2023-01-16 HISTORY — DX: Family history of other specified conditions: Z84.89

## 2023-01-16 HISTORY — DX: Other specified health status: Z78.9

## 2023-01-16 HISTORY — DX: Mixed incontinence: N39.46

## 2023-01-16 HISTORY — DX: Personal history of colonic polyps: Z86.010

## 2023-01-16 HISTORY — DX: Unspecified osteoarthritis, unspecified site: M19.90

## 2023-01-16 HISTORY — DX: Personal history of irradiation: Z92.3

## 2023-01-16 HISTORY — DX: Dermatitis, unspecified: L30.9

## 2023-01-16 HISTORY — DX: Other complications of anesthesia, initial encounter: T88.59XA

## 2023-01-16 HISTORY — DX: Failed or difficult intubation, initial encounter: T88.4XXA

## 2023-01-16 HISTORY — DX: Gastro-esophageal reflux disease without esophagitis: K21.9

## 2023-01-16 HISTORY — DX: Presence of spectacles and contact lenses: Z97.3

## 2023-01-16 HISTORY — DX: Abnormal uterine and vaginal bleeding, unspecified: N93.9

## 2023-01-16 HISTORY — DX: Overactive bladder: N32.81

## 2023-01-16 HISTORY — DX: Personal history of adenomatous and serrated colon polyps: Z86.0101

## 2023-01-16 HISTORY — DX: Other constipation: K59.09

## 2023-01-16 HISTORY — DX: Other specified postprocedural states: Z98.890

## 2023-01-16 LAB — BASIC METABOLIC PANEL
Anion gap: 12 (ref 5–15)
BUN: 14 mg/dL (ref 8–23)
CO2: 21 mmol/L — ABNORMAL LOW (ref 22–32)
Calcium: 9.2 mg/dL (ref 8.9–10.3)
Chloride: 107 mmol/L (ref 98–111)
Creatinine, Ser: 0.62 mg/dL (ref 0.44–1.00)
GFR, Estimated: >60 mL/min (ref >60)
Glucose, Bld: 90 mg/dL (ref 70–99)
Potassium: 3.9 mmol/L (ref 3.5–5.1)
Sodium: 140 mmol/L (ref 135–145)

## 2023-01-16 LAB — CBC
HCT: 43.9 % (ref 36.0–46.0)
Hemoglobin: 14.1 g/dL (ref 12.0–15.0)
MCH: 28.7 pg (ref 26.0–34.0)
MCHC: 32.1 g/dL (ref 30.0–36.0)
MCV: 89.2 fL (ref 80.0–100.0)
Platelets: 232 10*3/uL (ref 150–400)
RBC: 4.92 MIL/uL (ref 3.87–5.11)
RDW: 14.2 % (ref 11.5–15.5)
WBC: 6.2 10*3/uL (ref 4.0–10.5)
nRBC: 0 % (ref 0.0–0.2)

## 2023-01-16 LAB — TYPE AND SCREEN
ABO/RH(D): A POS
Antibody Screen: NEGATIVE

## 2023-01-16 LAB — ABO/RH: ABO/RH(D): A POS

## 2023-01-16 SURGERY — DILATATION & CURETTAGE/HYSTEROSCOPY WITH MYOSURE
Anesthesia: General | Site: Vagina

## 2023-01-16 MED ORDER — PHENYLEPHRINE 80 MCG/ML (10ML) SYRINGE FOR IV PUSH (FOR BLOOD PRESSURE SUPPORT)
PREFILLED_SYRINGE | INTRAVENOUS | Status: AC
  Filled 2023-01-16: qty 10

## 2023-01-16 MED ORDER — ONDANSETRON HCL 4 MG/2ML IJ SOLN
INTRAMUSCULAR | Status: DC | PRN
  Administered 2023-01-16: 4 mg via INTRAVENOUS

## 2023-01-16 MED ORDER — LIDOCAINE 2% (20 MG/ML) 5 ML SYRINGE
INTRAMUSCULAR | Status: AC
  Filled 2023-01-16: qty 5

## 2023-01-16 MED ORDER — CEFAZOLIN SODIUM-DEXTROSE 1-4 GM/50ML-% IV SOLN
INTRAVENOUS | Status: DC | PRN
  Administered 2023-01-16: 2 g via INTRAVENOUS

## 2023-01-16 MED ORDER — GLYCOPYRROLATE PF 0.2 MG/ML IJ SOSY
PREFILLED_SYRINGE | INTRAMUSCULAR | Status: AC
  Filled 2023-01-16: qty 1

## 2023-01-16 MED ORDER — ORAL CARE MOUTH RINSE: 15.0000 mL | Freq: Once | OROMUCOSAL | Status: AC

## 2023-01-16 MED ORDER — ROCURONIUM BROMIDE 10 MG/ML (PF) SYRINGE
PREFILLED_SYRINGE | INTRAVENOUS | Status: AC
  Filled 2023-01-16: qty 10

## 2023-01-16 MED ORDER — LIDOCAINE HCL 1 % IJ SOLN
INTRAMUSCULAR | Status: DC | PRN
  Administered 2023-01-16: 10 mL

## 2023-01-16 MED ORDER — MIDAZOLAM HCL 2 MG/2ML IJ SOLN
INTRAMUSCULAR | Status: DC | PRN
  Administered 2023-01-16: 1 mg via INTRAVENOUS

## 2023-01-16 MED ORDER — CEFAZOLIN SODIUM-DEXTROSE 2-4 GM/100ML-% IV SOLN
INTRAVENOUS | Status: AC
  Filled 2023-01-16: qty 100

## 2023-01-16 MED ORDER — PROPOFOL 10 MG/ML IV BOLUS
INTRAVENOUS | Status: AC
  Filled 2023-01-16: qty 20

## 2023-01-16 MED ORDER — SOD CITRATE-CITRIC ACID 500-334 MG/5ML PO SOLN
ORAL | Status: AC
  Administered 2023-01-16: 30 mL
  Filled 2023-01-16: qty 30

## 2023-01-16 MED ORDER — FENTANYL CITRATE (PF) 250 MCG/5ML IJ SOLN
INTRAMUSCULAR | Status: AC
  Filled 2023-01-16: qty 5

## 2023-01-16 MED ORDER — DEXAMETHASONE SODIUM PHOSPHATE 10 MG/ML IJ SOLN
INTRAMUSCULAR | Status: AC
  Filled 2023-01-16: qty 1

## 2023-01-16 MED ORDER — GLYCOPYRROLATE PF 0.2 MG/ML IJ SOSY
PREFILLED_SYRINGE | INTRAMUSCULAR | Status: DC | PRN
  Administered 2023-01-16: .1 mg via INTRAVENOUS

## 2023-01-16 MED ORDER — FENTANYL CITRATE (PF) 250 MCG/5ML IJ SOLN
INTRAMUSCULAR | Status: DC | PRN
  Administered 2023-01-16: 50 ug via INTRAVENOUS

## 2023-01-16 MED ORDER — AMISULPRIDE (ANTIEMETIC) 5 MG/2ML IV SOLN: 10.0000 mg | Freq: Once | INTRAVENOUS | Status: DC | PRN

## 2023-01-16 MED ORDER — DEXAMETHASONE SODIUM PHOSPHATE 10 MG/ML IJ SOLN
INTRAMUSCULAR | Status: DC | PRN
  Administered 2023-01-16: 10 mg via INTRAVENOUS

## 2023-01-16 MED ORDER — CHLORHEXIDINE GLUCONATE 0.12 % MT SOLN
OROMUCOSAL | Status: AC
  Administered 2023-01-16: 15 mL via OROMUCOSAL
  Filled 2023-01-16: qty 15

## 2023-01-16 MED ORDER — LACTATED RINGERS IV SOLN: INTRAVENOUS | Status: DC

## 2023-01-16 MED ORDER — PROPOFOL 10 MG/ML IV BOLUS
INTRAVENOUS | Status: DC | PRN
  Administered 2023-01-16: 50 mg via INTRAVENOUS
  Administered 2023-01-16: 200 mg via INTRAVENOUS

## 2023-01-16 MED ORDER — LIDOCAINE 2% (20 MG/ML) 5 ML SYRINGE
INTRAMUSCULAR | Status: DC | PRN
  Administered 2023-01-16: 100 mg via INTRAVENOUS

## 2023-01-16 MED ORDER — LIDOCAINE HCL 1 % IJ SOLN
INTRAMUSCULAR | Status: AC
  Filled 2023-01-16: qty 20

## 2023-01-16 MED ORDER — ONDANSETRON HCL 4 MG/2ML IJ SOLN
INTRAMUSCULAR | Status: AC
  Filled 2023-01-16: qty 2

## 2023-01-16 MED ORDER — SODIUM CHLORIDE 0.9 % IR SOLN
Status: DC | PRN
  Administered 2023-01-16: 3000 mL

## 2023-01-16 MED ORDER — ACETAMINOPHEN 10 MG/ML IV SOLN: 1000.0000 mg | Freq: Once | INTRAVENOUS | Status: DC | PRN

## 2023-01-16 MED ORDER — MIDAZOLAM HCL 2 MG/2ML IJ SOLN
INTRAMUSCULAR | Status: AC
  Filled 2023-01-16: qty 2

## 2023-01-16 MED ORDER — CHLORHEXIDINE GLUCONATE 0.12 % MT SOLN: 15.0000 mL | Freq: Once | OROMUCOSAL | Status: AC

## 2023-01-16 MED ORDER — FENTANYL CITRATE (PF) 100 MCG/2ML IJ SOLN: 25.0000 ug | INTRAMUSCULAR | Status: DC | PRN

## 2023-01-16 SURGICAL SUPPLY — 14 items
CATH ROBINSON RED A/P 16FR (CATHETERS) ×2 IMPLANT
DEVICE MYOSURE LITE (MISCELLANEOUS) IMPLANT
DEVICE MYOSURE REACH (MISCELLANEOUS) IMPLANT
GLOVE BIO SURGEON STRL SZ8 (GLOVE) ×4 IMPLANT
GLOVE SURG UNDER POLY LF SZ7 (GLOVE) ×2 IMPLANT
GOWN STRL REUS W/ TWL LRG LVL3 (GOWN DISPOSABLE) ×4 IMPLANT
GOWN STRL REUS W/TWL LRG LVL3 (GOWN DISPOSABLE) ×2
KIT PROCEDURE FLUENT (KITS) ×2 IMPLANT
KIT TURNOVER KIT B (KITS) ×2 IMPLANT
PACK VAGINAL MINOR WOMEN LF (CUSTOM PROCEDURE TRAY) ×2 IMPLANT
PAD OB MATERNITY 4.3X12.25 (PERSONAL CARE ITEMS) ×2 IMPLANT
SEAL ROD LENS SCOPE MYOSURE (ABLATOR) ×2 IMPLANT
TOWEL GREEN STERILE FF (TOWEL DISPOSABLE) ×4 IMPLANT
UNDERPAD 30X36 HEAVY ABSORB (UNDERPADS AND DIAPERS) ×2 IMPLANT

## 2023-01-16 NOTE — Op Note (Unsigned)
NAMEPECOLA, CROOKS MEDICAL RECORD NO: [id number] ACCOUNT NO: [id number] DATE OF BIRTH: [birth year] FACILITY: [facility] LOCATION: [facility] PHYSICIAN: Daleen Bo. Lyn Hollingshead, MD  Operative Report   DATE OF PROCEDURE: 01/16/2023  PREOPERATIVE DIAGNOSIS:  Abnormal uterine bleeding.  POSTOPERATIVE DIAGNOSIS:  Abnormal uterine bleeding.  PROCEDURE:  Hysteroscopy with MyoSure resection of endometrial polyp and dilation and curettage.  SURGEON:  Daleen Bo. Lyn Hollingshead, MD  ANESTHESIA:  General with LMA.  ESTIMATED BLOOD LOSS:  15 mL.  SPECIMENS:  Endometrial polyp and endometrial curettings both to pathology.  I AND O'S OF DISTENDING MEDIA:  95 mL deficit.  INDICATIONS AND CONSENT: This patient is a 67 year old patient with abnormal uterine bleeding.  This has been recurrent.  Hysteroscopy, D and C and possible MyoSure resection has been discussed preoperatively.  Potential risks and complications are  reviewed preoperatively including but not limited to infection, uterine perforation, organ damage, bleeding requiring transfusion of blood products with HIV and hepatitis acquisition, DVT, PE, pneumonia, recurrent polyps.  She states she understands and  agrees and consent is signed on the chart.  FINDINGS: Both fallopian tube ostia were identified.  There is an approximately 8 mm polypoid mass in the upper left endometrial cavity.  Otherwise, the endometrium is atrophic.  DESCRIPTION OF PROCEDURE:  The patient was taken to the operating room where she was identified, placed in the dorsal supine position and general anesthesia was induced via LMA.  She was placed in the dorsal lithotomy position.  Timeout was done.  She  was prepped vaginally with Betadine, bladder straight catheterized and she was draped in a sterile fashion.  Bivalve speculum was placed in the vagina.  The anterior cervical lip was injected with 1% lidocaine plain and grasped with a single tooth  tenaculum.  Approximately  10 mL of the same solution was injected at the 2, 4, 5, 7, 8 and 10 o'clock positions to the cervix.  Cervix was gently progressively dilated.  The hysteroscope was placed in the endocervical canal and advanced under direct  visualization using distending media.  The above findings were noted.  MyoSure was then used to resect the polyp under direct visualization without difficulty.  Hysteroscope was withdrawn and gentle sharp curettage was done for scant tissue.   Reinspection with the hysteroscope reveals good distention of the cavity and the cavity is clean.  Hysteroscope was withdrawn.  All instruments were removed.  All counts were correct and the patient is awakened and taken to the recovery room in stable  condition.   PUS D: 01/16/2023 2:06:54 pm T: 01/16/2023 3:48:00 pm  JOB: [id number]

## 2023-01-16 NOTE — Anesthesia Procedure Notes (Signed)
Procedure Name: LMA Insertion Date/Time: 01/16/2023 1:14 PM  Performed by: Ester Rink, CRNAPre-anesthesia Checklist: Patient identified, Emergency Drugs available, Suction available and Patient being monitored Patient Re-evaluated:Patient Re-evaluated prior to induction Oxygen Delivery Method: Circle system utilized Preoxygenation: Pre-oxygenation with 100% oxygen Induction Type: IV induction Ventilation: Mask ventilation without difficulty and Oral airway inserted - appropriate to patient size LMA: LMA inserted LMA Size: 3.0 Tube type: Oral Number of attempts: 1 Airway Equipment and Method: Oral airway Placement Confirmation: ETT inserted through vocal cords under direct vision, positive ETCO2 and breath sounds checked- equal and bilateral Tube secured with: Tape Dental Injury: Teeth and Oropharynx as per pre-operative assessment

## 2023-01-16 NOTE — Anesthesia Preprocedure Evaluation (Addendum)
Anesthesia Evaluation  Patient identified by MRN, date of birth, ID band Patient awake    Reviewed: Allergy & Precautions, NPO status , Patient's Chart, lab work & pertinent test results  History of Anesthesia Complications (+) PONV, DIFFICULT AIRWAY, Family history of anesthesia reaction and history of anesthetic complications  Airway Mallampati: III  TM Distance: >3 FB Neck ROM: Limited  Mouth opening: Limited Mouth Opening Comment: Previous intubation note: "DL x 1 with Miller 3. Patient has very small mouth opening and limited ROM of neck and jaw. Unable gain any type of view except tip of epiglottis. Glidescope called to the room. VL x 1 with glidescope, still remains a very anterior view, Dr. Wynn Banker applying tracheal pressure and manipulation and I was able to get ETT to glottic opening. Stylet removed and ETT twisted into place. MOP to cuff. +/=BBS +ETCO2 present Mouth unchanged/no trauma noted". She has also been fiberoptically intubated. Dental  (+) Dental Advisory Given   Pulmonary neg shortness of breath, neg sleep apnea, neg COPD, neg recent URI, former smoker   Pulmonary exam normal breath sounds clear to auscultation       Cardiovascular (-) hypertension(-) angina + CAD  (-) Cardiac Stents and (-) CABG (-) dysrhythmias  Rhythm:Regular Rate:Normal     Neuro/Psych negative neurological ROS     GI/Hepatic Neg liver ROS,GERD  Medicated,,  Endo/Other  neg diabetes  Multinodular thyroid  Renal/GU negative Renal ROS Bladder dysfunction      Musculoskeletal  (+) Arthritis , Osteoarthritis,    Abdominal   Peds  Hematology negative hematology ROS (+)   Anesthesia Other Findings Left breast cancer  Reproductive/Obstetrics                             Anesthesia Physical Anesthesia Plan  ASA: 2  Anesthesia Plan: General   Post-op Pain Management:    Induction: Intravenous  PONV Risk  Score and Plan: 4 or greater and Ondansetron, Dexamethasone and Treatment may vary due to age or medical condition  Airway Management Planned: LMA  Additional Equipment:   Intra-op Plan:   Post-operative Plan: Extubation in OR  Informed Consent: I have reviewed the patients History and Physical, chart, labs and discussed the procedure including the risks, benefits and alternatives for the proposed anesthesia with the patient or authorized representative who has indicated his/her understanding and acceptance.     Dental advisory given  Plan Discussed with: CRNA and Anesthesiologist  Anesthesia Plan Comments: (Plan for LMA with glidescope immediately available for intubation if LMA does not seat. If patient requires intubation, she requests a small tube due to having a small throat.  Risks of general anesthesia discussed including, but not limited to, sore throat, hoarse voice, chipped/damaged teeth, injury to vocal cords, nausea and vomiting, allergic reactions, lung infection, heart attack, stroke, and death. All questions answered. )        Anesthesia Quick Evaluation

## 2023-01-16 NOTE — Anesthesia Postprocedure Evaluation (Signed)
Anesthesia Post Note  Patient: Melanie Roth  Procedure(s) Performed: DILATATION & CURETTAGE/HYSTEROSCOPY WITH POSSIBLE MYOSURE (Vagina )     Patient location during evaluation: PACU Anesthesia Type: General Level of consciousness: awake Pain management: pain level controlled Vital Signs Assessment: post-procedure vital signs reviewed and stable Respiratory status: spontaneous breathing, nonlabored ventilation and respiratory function stable Cardiovascular status: blood pressure returned to baseline and stable Postop Assessment: no apparent nausea or vomiting Anesthetic complications: no   There were no known notable events for this encounter.  Last Vitals:  Vitals:   01/16/23 1430 01/16/23 1445  BP: (!) 147/81 (!) 143/82  Pulse: 89 67  Resp: 13 12  Temp:  36.4 C  SpO2: 98% 96%    Last Pain:  Vitals:   01/16/23 1108  TempSrc:   PainSc: 8                  Nilda Simmer

## 2023-01-16 NOTE — Transfer of Care (Signed)
Immediate Anesthesia Transfer of Care Note  Patient: Maurice Small  Procedure(s) Performed: DILATATION & CURETTAGE/HYSTEROSCOPY WITH POSSIBLE MYOSURE (Vagina )  Patient Location: PACU  Anesthesia Type:General  Level of Consciousness: awake, alert , and oriented  Airway & Oxygen Therapy: Patient connected to face mask oxygen  Post-op Assessment: Report given to RN and Post -op Vital signs reviewed and stable  Post vital signs: Reviewed and stable  Last Vitals:  Vitals Value Taken Time  BP    Temp    Pulse    Resp    SpO2      Last Pain:  Vitals:   01/16/23 1108  TempSrc:   PainSc: 8       Patients Stated Pain Goal: 2 ([phone])  Complications: There were no known notable events for this encounter.

## 2023-01-16 NOTE — Progress Notes (Signed)
No changes to H&P per patient history Reviewed procedure-H/S, D&C, possible Myosure resection Allergies : codeine > N/V PO instructions reviewed All questions answered She states she understands and agrees

## 2023-01-16 NOTE — H&P (Signed)
Melanie Roth is an 67 y.o. female. She had abnormal uterine bleeding. U/S in office 03/12/22 noted a 1.3 mm EM stripe and on SHG no intracvitary lesions. Ovaries not visualized. She again had abnormal uterine bleeding for several days.   Pertinent Gynecological History: Menses: post-menopausal Bleeding:  Contraception: none DES exposure: denies Blood transfusions: none Sexually transmitted diseases: no past history Previous GYN Procedures: DNC  Last mammogram: normal Date: 12/22 Last pap: normal Date: 2022 OB History: G3, P2   Menstrual History: Menarche age: unknown No LMP recorded. Patient is postmenopausal.    Past Medical History:  Diagnosis Date   Abnormal uterine bleeding ([place]B)    Chronic constipation    Complication of anesthesia    01-13-2023  per pt she has small throat and mouth request use of very small tube since she has had issue previously with severe throat pain from tube put on prednisone , and difficult IV stick due to small veins   Coronary artery calcification seen on CAT scan 02/2022   cardiologist--- dr Percival Spanish;   Difficult intravenous access    per pt has previous issue with anyone getting IV,  told has small veins   Difficult intubation    01-14-2023  reviewed chart w/ Dr Rex Kras and w/ previous anesthesia record from surgery @ [facility] 10-15-2021 which shows diffucult intubation, pt not candidate for ambulatory surgery center   Eczema    Family history of adverse reaction to anesthesia    brother---   ponv   GERD (gastroesophageal reflux disease)    Hemorrhoids    external   History of adenomatous polyp of colon    History of external beam radiation therapy    left breast  11-27-2021  to  [id number]   History of Helicobacter pylori infection 01/30/2022   EGD bx --- per pt treated   Malignant neoplasm of central portion of left breast in female, estrogen receptor negative ([place]) 09/2021   oncology--- dr a hensely/ oncology surgeon-- dr Rocky Morel;   dx 10-08-2021;   10-15-2021 , Stage II  invasive metaplastic carcinoma (ER/PR --)  /p breast lumpectomy w/ node dissection;  completed radiation 12-17-2021   Mixed urge and stress incontinence    Multinodular thyroid 2016   nontoxic;   incidental finding on CT 2016;   last bx in epic [id number] benign follicular left nodule;   last ultrasound in care everywhere 06-02-2019   OA (osteoarthritis)    knees, handa, all over   OAB (overactive bladder)    PONV (postoperative nausea and vomiting)    Wears glasses     Past Surgical History:  Procedure Laterality Date   BREAST LUMPECTOMY WITH AXILLARY LYMPH NODE BIOPSY Left 10/15/2021   '@[facility]'$  by dr Johnette Abraham. Lennie Odor   COLONOSCOPY WITH ESOPHAGOGASTRODUODENOSCOPY (EGD)  01/30/2022   [place]   HYSTEROSCOPY WITH D & C  01/02/2021   '@[facility]'$  by dr t. Quentin Cornwall;   polypectomy   TUBAL LIGATION Bilateral    yrs ago    Family History  Problem Relation Age of Onset   CVA Mother        Light stroke   Heart disease Mother 75       Stents   Colon cancer Brother 39       2003 started treatments    Colon polyps Brother    Colon polyps Brother    Colon polyps Brother    Ovarian cancer Maternal Aunt    Cancer - Other Maternal Aunt  Liver cancer Maternal Uncle    Thyroid disease Neg Hx    Rectal cancer Neg Hx    Stomach cancer Neg Hx     Social History:  reports that she quit smoking about 21 years ago. Her smoking use included cigarettes. She has never used smokeless tobacco. She reports current alcohol use. She reports that she does not use drugs.  Allergies:  Allergies  Allergen Reactions   Codeine Nausea Only   Oxycodone Nausea And Vomiting    Pt states she was "out of it"   Pravastatin Other (See Comments)    Other Reaction(s): Arthralgias   Progesterone Other (See Comments)    Other Reaction(s): severe hot flashes   Rosuvastatin Other (See Comments)    Other Reaction(s): Arthralgias    No medications prior to  admission.    Review of Systems  Constitutional:  Negative for fever.    Height 5' 1.5" (1.562 m), weight 89.8 kg. Physical Exam Cardiovascular:     Rate and Rhythm: Normal rate.  Pulmonary:     Effort: Pulmonary effort is normal.     No results found for this or any previous visit (from the past 24 hour(s)).  No results found.  Assessment/Plan: 67 yo With abnormal uterine bleeding H/S, D&C, possible Myosure resection D/W patient including risks of infection, uterine perforation and organ damage, bleeding/transfusion-HIV/Hep, DVT/PE, pneumonia.  Shon Millet II 01/16/2023, 9:07 AM

## 2023-01-16 NOTE — Progress Notes (Signed)
01/16/2023  2:01 PM  PATIENT:  Melanie Roth  67 y.o. female  PRE-OPERATIVE DIAGNOSIS:  ABNORMAL UTERINE BLEEDING  POST-OPERATIVE DIAGNOSIS:  ABNORMAL UTERINE BLEEDING  PROCEDURE:  Procedure(s): DILATATION & CURETTAGE/HYSTEROSCOPY WITH POSSIBLE MYOSURE (N/A)  SURGEON:  Surgeon(s) and Role:    * Everlene Farrier, MD - Primary  PHYSICIAN ASSISTANT:   ASSISTANTS: none   ANESTHESIA:   general  EBL:  20 mL   BLOOD ADMINISTERED:none  DRAINS: none   LOCAL MEDICATIONS USED:  LIDOCAINE  and Amount: 10 ml  SPECIMEN:  Source of Specimen:  endometrial polyp, endometrial curetting  DISPOSITION OF SPECIMEN:  PATHOLOGY  COUNTS:  YES  TOURNIQUET:  * No tourniquets in log *  DICTATION: .Other Dictation: Dictation Number [id number]  PLAN OF CARE: Discharge to home after PACU  PATIENT DISPOSITION:  PACU - hemodynamically stable.   Delay start of Pharmacological VTE agent (>24hrs) due to surgical blood loss or risk of bleeding: not applicable

## 2023-01-17 ENCOUNTER — Encounter (HOSPITAL_COMMUNITY): Payer: Self-pay | Admitting: Obstetrics and Gynecology

## 2023-01-19 LAB — SURGICAL PATHOLOGY

## 2023-03-16 ENCOUNTER — Other Ambulatory Visit (HOSPITAL_COMMUNITY): Payer: Self-pay | Admitting: *Deleted

## 2023-03-17 ENCOUNTER — Ambulatory Visit (HOSPITAL_COMMUNITY)
Admission: RE | Admit: 2023-03-17 | Discharge: 2023-03-17 | Disposition: A | Discharge status: Home / Self Care | Payer: 59 | Source: Ambulatory Visit | Attending: Internal Medicine | Admitting: Internal Medicine

## 2023-03-17 DIAGNOSIS — E785 Hyperlipidemia, unspecified: Secondary | ICD-10-CM | POA: Insufficient documentation

## 2023-03-17 DIAGNOSIS — I2510 Atherosclerotic heart disease of native coronary artery without angina pectoris: Secondary | ICD-10-CM | POA: Insufficient documentation

## 2023-03-17 MED ORDER — INCLISIRAN SODIUM 284 MG/1.5ML ~~LOC~~ SOSY
284.0000 mg | PREFILLED_SYRINGE | Freq: Once | SUBCUTANEOUS | Status: AC
  Administered 2023-03-17: 284 mg via SUBCUTANEOUS

## 2023-03-17 MED ORDER — INCLISIRAN SODIUM 284 MG/1.5ML ~~LOC~~ SOSY
PREFILLED_SYRINGE | SUBCUTANEOUS | Status: AC
  Filled 2023-03-17: qty 1.5

## 2023-05-22 ENCOUNTER — Telehealth: Payer: Self-pay | Admitting: *Deleted

## 2023-05-22 NOTE — Telephone Encounter (Signed)
   Pre-operative Risk Assessment    Patient Name: Melanie Roth  DOB: May 07, 1956 MRN: [id number]      Request for Surgical Clearance    Procedure:   LEFT TOTAL KNEE ARTHROPLASTY  Date of Surgery:  Clearance 09/07/23                                 Surgeon:  DR. Ollen Gross Surgeon's Group or Practice Name:  [facility] Phone number:  [phone] ATTN: Aida Raider Fax number:  [phone]   Type of Clearance Requested:   - Medical ; NO MEDICATIONS LISTED AS NEEDING TO BE HELD   Type of Anesthesia:   CHOICE   Additional requests/questions:    Elpidio Anis   05/22/2023, 1:12 PM

## 2023-05-25 NOTE — Telephone Encounter (Signed)
Patient scheduled for in office visit with Dr. Antoine Poche on 09/03/23 for clearance.

## 2023-05-25 NOTE — Telephone Encounter (Signed)
    Primary Cardiologist:James Hochrein, MD  Chart reviewed as part of pre-operative protocol coverage. Because of Melanie Roth's past medical history and time since last visit, he/she will require a follow-up Office visit in order to better assess preoperative cardiovascular risk.  Pre-op covering staff: - Please schedule appointment and call patient to inform them. - Please contact requesting surgeon's office via preferred method (i.e, phone, fax) to inform them of need for appointment prior to surgery.  If applicable, this message will also be routed to pharmacy pool and/or primary cardiologist for input on holding anticoagulant/antiplatelet agent as requested below so that this information is available at time of patient's appointment.   Ronney Asters, NP  05/25/2023, 11:32 AM

## 2023-06-03 ENCOUNTER — Other Ambulatory Visit: Payer: Self-pay | Admitting: Family Medicine

## 2023-06-03 DIAGNOSIS — E041 Nontoxic single thyroid nodule: Secondary | ICD-10-CM

## 2023-06-11 ENCOUNTER — Ambulatory Visit
Admission: RE | Admit: 2023-06-11 | Discharge: 2023-06-11 | Disposition: A | Discharge status: Home / Self Care | Payer: 59 | Source: Ambulatory Visit | Attending: Family Medicine | Admitting: Family Medicine

## 2023-06-11 DIAGNOSIS — E041 Nontoxic single thyroid nodule: Secondary | ICD-10-CM

## 2023-06-17 ENCOUNTER — Telehealth: Payer: Self-pay

## 2023-06-17 NOTE — Telephone Encounter (Signed)
   Pre-operative Risk Assessment    Patient Name: Melanie Roth  DOB: 1956-07-15 MRN: [id number]     Request for Surgical Clearance    Procedure:  Left total knee arthoplasty   Date of Surgery:  Clearance 08/03/23                                 Surgeon:  Dr. Ollen Gross Surgeon's Group or Practice Name:  [facility] Phone number:  [phone] Fax number:  [phone]   Type of Clearance Requested:   - Medical    Type of Anesthesia:  Choice    Additional requests/questions:   SignedVernard Gambles   06/17/2023, 4:19 PM

## 2023-06-18 ENCOUNTER — Telehealth: Payer: Self-pay | Admitting: *Deleted

## 2023-06-18 NOTE — Telephone Encounter (Signed)
   Name: Melanie Roth  DOB: 07-15-1956  MRN: [id number]  Primary Cardiologist: Rollene Rotunda, MD   Preoperative team, please contact this patient and set up a phone call appointment for further preoperative risk assessment. Please obtain consent and complete medication review. Thank you for your help. Last seen on 01/08/2023  I confirm that guidance regarding antiplatelet and oral anticoagulation therapy has been completed and, if necessary, noted below.   Joni Reining, NP 06/18/2023, 1:05 PM [facility]

## 2023-06-18 NOTE — Telephone Encounter (Signed)
Will route to requesting surgeon's office to make them aware.

## 2023-06-18 NOTE — Telephone Encounter (Signed)
  Patient Consent for Virtual Visit        Melanie Roth has provided verbal consent on 06/18/2023 for a virtual visit (video or telephone).   CONSENT FOR VIRTUAL VISIT FOR:  Melanie Roth  By participating in this virtual visit I agree to the following:  I hereby voluntarily request, consent and authorize [facility] and its employed or contracted physicians, physician assistants, nurse practitioners or other licensed health care professionals (the Practitioner), to provide me with telemedicine health care services (the "Services") as deemed necessary by the treating Practitioner. I acknowledge and consent to receive the Services by the Practitioner via telemedicine. I understand that the telemedicine visit will involve communicating with the Practitioner through live audiovisual communication technology and the disclosure of certain medical information by electronic transmission. I acknowledge that I have been given the opportunity to request an in-person assessment or other available alternative Roth to the telemedicine visit and am voluntarily participating in the telemedicine visit.  I understand that I have the right to withhold or withdraw my consent to the use of telemedicine in the course of my care at any time, without affecting my right to future care or treatment, and that the Practitioner or I may terminate the telemedicine visit at any time. I understand that I have the right to inspect all information obtained and/or recorded in the course of the telemedicine visit and may receive copies of available information for a reasonable fee.  I understand that some of the potential risks of receiving the Services via telemedicine include:  Delay or interruption in medical evaluation due to technological equipment failure or disruption; Information transmitted may not be sufficient (e.g. poor resolution of images) to allow for appropriate medical decision making  by the Practitioner; and/or  In rare instances, security protocols could fail, causing a breach of personal health information.  Furthermore, I acknowledge that it is my responsibility to provide information about my medical history, conditions and care that is complete and accurate to the best of my ability. I acknowledge that Practitioner's advice, recommendations, and/or decision may be based on factors not within their control, such as incomplete or inaccurate data provided by me or distortions of diagnostic images or specimens that may result from electronic transmissions. I understand that the practice of medicine is not an exact science and that Practitioner makes no warranties or guarantees regarding treatment outcomes. I acknowledge that a copy of this consent can be made available to me via my patient portal [facility] MyChart), or I can request a printed copy by calling the office of [facility].    I understand that my insurance will be billed for this visit.   I have read or had this consent read to me. I understand the contents of this consent, which adequately explains the benefits and risks of the Services being provided via telemedicine.  I have been provided ample opportunity to ask questions regarding this consent and the Services and have had my questions answered to my satisfaction. I give my informed consent for the services to be provided through the use of telemedicine in my medical care

## 2023-06-18 NOTE — Telephone Encounter (Signed)
Spoke with patient and scheduled her for a pre op telehealth appt on 07/01/23 at 10:20 AM.

## 2023-07-01 ENCOUNTER — Ambulatory Visit: Payer: 59 | Attending: Internal Medicine

## 2023-07-01 DIAGNOSIS — Z01810 Encounter for preprocedural cardiovascular examination: Secondary | ICD-10-CM | POA: Diagnosis not present

## 2023-07-01 NOTE — Progress Notes (Signed)
Virtual Visit via Telephone Note   Because of Melanie Roth's co-morbid illnesses, she is at least at moderate risk for complications without adequate follow up.  This format is felt to be most appropriate for this patient at this time.  The patient did not have access to video technology/had technical difficulties with video requiring transitioning to audio format only (telephone).  All issues noted in this document were discussed and addressed.  No physical exam could be performed with this format.  Please refer to the patient's chart for her consent to telehealth for [facility]  Evaluation Performed:  Preoperative cardiovascular risk assessment _____________   Date:  07/01/2023   Patient ID:  Melanie Roth, DOB 11-22-1955, MRN [id number] Patient Location:  Home Provider location:   Office  Primary Care Provider:  Rodrigo Ran, MD Primary Cardiologist:  Rollene Rotunda, MD  Chief Complaint / Patient Profile   67 y.o. y/o female with a h/o elevated calcium score, arthritis, breast cancer who is pending left total knee arthroplasty and presents today for telephonic preoperative cardiovascular risk assessment.  History of Present Illness    Melanie Roth is a 67 y.o. female who presents via audio/video conferencing for a telehealth visit today.  Pt was last seen in cardiology clinic on 04/05/2023 by Dr. Antoine Poche.  At that time Melanie Roth was doing well no new cardiac complaints. There were no additional cardiac testing indicated at that time.  The patient is now pending procedure as outlined above. Since her last visit, she has been doing well with no new cardiac complaints since previous visit.  She denies chest pain, shortness of breath, lower extremity edema, fatigue, palpitations, melena, hematuria, hemoptysis, diaphoresis, weakness, presyncope, syncope, orthopnea, and PND.    Past Medical History    Past Medical History:   Diagnosis Date   Abnormal uterine bleeding (AUB)    Chronic constipation    Complication of anesthesia    01-13-2023  per pt she has small throat and mouth request use of very small tube since she has had issue previously with severe throat pain from tube put on prednisone , and difficult IV stick due to small veins   Coronary artery calcification seen on CAT scan 02/2022   cardiologist--- dr Antoine Poche;   Difficult intravenous access    per pt has previous issue with anyone getting IV,  told has small veins   Difficult intubation    01-14-2023  reviewed chart w/ Dr Curt Jews and w/ previous anesthesia record from surgery @ [facility] 10-15-2021 which shows diffucult intubation, pt not candidate for ambulatory surgery center   Eczema    Family history of adverse reaction to anesthesia    brother---   ponv   GERD (gastroesophageal reflux disease)    Hemorrhoids    external   History of adenomatous polyp of colon    History of external beam radiation therapy    left breast  11-27-2021  to  12-17-2021   History of Helicobacter pylori infection 01/30/2022   EGD bx --- per pt treated   Malignant neoplasm of central portion of left breast in female, estrogen receptor negative (HCC) 09/2021   oncology--- dr a hensely/ oncology surgeon-- dr Rachael Fee;   dx 10-08-2021;   10-15-2021 , Stage II  invasive metaplastic carcinoma (ER/PR --)  /p breast lumpectomy w/ node dissection;  completed radiation 12-17-2021   Mixed urge and stress incontinence    Multinodular thyroid 2016   nontoxic;   incidental finding on CT 2016;  last bx in epic 04-27-2018 benign follicular left nodule;   last ultrasound in care everywhere 06-02-2019   OA (osteoarthritis)    knees, handa, all over   OAB (overactive bladder)    PONV (postoperative nausea and vomiting)    Wears glasses    Past Surgical History:  Procedure Laterality Date   BREAST LUMPECTOMY WITH AXILLARY LYMPH NODE BIOPSY Left 10/15/2021   @[facility]  by dr Bea Laura.  Remus Loffler   COLONOSCOPY WITH ESOPHAGOGASTRODUODENOSCOPY (EGD)  01/30/2022   [place] [facility]   DILATATION & CURETTAGE/HYSTEROSCOPY WITH MYOSURE N/A 01/16/2023   Procedure: DILATATION & CURETTAGE/HYSTEROSCOPY WITH POSSIBLE MYOSURE;  Surgeon: Harold Hedge, MD;  Location: [facility] OR;  Service: Gynecology;  Laterality: N/A;   HYSTEROSCOPY WITH D & C  01/02/2021   @[facility]  by dr t. Roxan Hockey;   polypectomy   TUBAL LIGATION Bilateral    yrs ago    Allergies  Allergies  Allergen Reactions   Codeine Nausea Only   Oxycodone Nausea And Vomiting    Pt states she was "out of it"   Pravastatin Other (See Comments)    Other Reaction(s): Arthralgias   Progesterone Other (See Comments)    Other Reaction(s): severe hot flashes   Rosuvastatin Other (See Comments)    Other Reaction(s): Arthralgias    Home Medications    Prior to Admission medications   Medication Sig Start Date End Date Taking? Authorizing Provider  albuterol (VENTOLIN HFA) 108 (90 Base) MCG/ACT inhaler Inhale 2 puffs into the lungs every 6 (six) hours as needed for wheezing or shortness of breath (allergies).    [provider]  Bioflavonoid Products (ESTER-C PO) Take 1,000 mg by mouth daily.    [provider]  Cholecalciferol (VITAMIN D3) 125 MCG (5000 UT) CHEW Chew by mouth daily.    [provider]  diazepam (VALIUM) 5 MG tablet Take 5 mg by mouth every 6 (six) hours as needed for muscle spasms.     [provider]  fluticasone (FLONASE) 50 MCG/ACT nasal spray Place 1 spray into both nostrils daily as needed for allergies.    [provider]  ibuprofen (ADVIL,MOTRIN) 200 MG tablet Take 200 mg by mouth every 6 (six) hours as needed for moderate pain.    [provider]  loratadine (CLARITIN) 10 MG tablet Take 10 mg by mouth daily as needed for allergies.    [provider]  Magnesium 250 MG TABS Take 250 mg by mouth at bedtime.    [provider]  meloxicam (MOBIC)  15 MG tablet Take 15 mg by mouth daily as needed for pain.    [provider]  Multiple Vitamins-Minerals (HAIR/SKIN/NAILS) CAPS Take 1 capsule by mouth daily after lunch.    [provider]  Multiple Vitamins-Minerals (WOMENS MULTIVITAMIN PO) Take 1 tablet by mouth daily after lunch.    [provider]  nitroGLYCERIN (NITROSTAT) 0.4 MG SL tablet Place 1 tablet (0.4 mg total) under the tongue every 5 (five) minutes as needed for chest pain. 04/04/22 01/14/23  Rollene Rotunda, MD  Omega-3 Fatty Acids (OMEGA-3 FISH OIL PO) Take 1,000 mg by mouth daily after lunch.    [provider]  pantoprazole (PROTONIX) 40 MG tablet Take 40 mg by mouth daily as needed (Heartburn).    [provider]  PRESCRIPTION MEDICATION Per pt injection every 6 months for cholesterol prescribed by Dr Waynard Edwards (PCP)    [provider]  triamcinolone cream (KENALOG) 0.1 % Apply 1 Application topically as needed (Eczema).  [provider]    Physical Exam    Vital Signs:  Melanie Roth does not have vital signs available for review today.  Given telephonic nature of communication, physical exam is limited. AAOx3. NAD. Normal affect.  Speech and respirations are unlabored.  Accessory Clinical Findings    None  Assessment & Plan    1.  Preoperative Cardiovascular Risk Assessment: -Patient's RCRI score is 0.9%  The patient affirms she has been doing well without any new cardiac symptoms. They are able to achieve 4 METS without cardiac limitations. Therefore, based on ACC/AHA guidelines, the patient would be at acceptable risk for the planned procedure without further cardiovascular testing. The patient was advised that if she develops new symptoms prior to surgery to contact our office to arrange for a follow-up visit, and she verbalized understanding.   The patient was advised that if she develops new symptoms prior to surgery to contact our office to  arrange for a follow-up visit, and she verbalized understanding.    A copy of this note will be routed to requesting surgeon.  Time:   Today, I have spent 8 minutes with the patient with telehealth technology discussing medical history, symptoms, and management plan.     Napoleon Form, Leodis Rains, NP  07/01/2023, 7:22 AM

## 2023-07-08 NOTE — H&P (Signed)
TOTAL KNEE ADMISSION H&P  Patient is being admitted for left total knee arthroplasty.  Subjective:  Chief Complaint: Left knee pain.  HPI: Melanie Roth, 67 y.o. female has a history of pain and functional disability in the left knee due to arthritis and has failed non-surgical conservative treatments for greater than 12 weeks to include NSAID's and/or analgesics, corticosteriod injections, use of assistive devices, and activity modification. Onset of symptoms was gradual, starting several years ago with gradually worsening course since that time. The patient noted no past surgery on the left knee.  Patient currently rates pain in the left knee at 7 out of 10 with activity. Patient has night pain, worsening of pain with activity and weight bearing, pain that interferes with activities of daily living, and pain with passive range of motion. Patient has evidence of  severe bone-on-bone arthritis in the medial and patellofemoral compartments of the left knee, with massive osteophyte formation and tibial subluxation  by imaging studies. There is no active infection.  Patient Active Problem List   Diagnosis Date Noted   Elevated coronary artery calcium score 04/03/2022   Thyroid nodule 01/03/2015    Past Medical History:  Diagnosis Date   Abnormal uterine bleeding (AUB)    Chronic constipation    Complication of anesthesia    01-13-2023  per pt she has small throat and mouth request use of very small tube since she has had issue previously with severe throat pain from tube put on prednisone , and difficult IV stick due to small veins   Coronary artery calcification seen on CAT scan 02/2022   cardiologist--- dr Antoine Poche;   Difficult intravenous access    per pt has previous issue with anyone getting IV,  told has small veins   Difficult intubation    01-14-2023  reviewed chart w/ Dr Curt Jews and w/ previous anesthesia record from surgery @ [facility] 10-15-2021 which shows diffucult  intubation, pt not candidate for ambulatory surgery center   Eczema    Family history of adverse reaction to anesthesia    brother---   ponv   GERD (gastroesophageal reflux disease)    Hemorrhoids    external   History of adenomatous polyp of colon    History of external beam radiation therapy    left breast  11-27-2021  to  12-17-2021   History of Helicobacter pylori infection 01/30/2022   EGD bx --- per pt treated   Malignant neoplasm of central portion of left breast in female, estrogen receptor negative (HCC) 09/2021   oncology--- dr a hensely/ oncology surgeon-- dr Rachael Fee;   dx 10-08-2021;   10-15-2021 , Stage II  invasive metaplastic carcinoma (ER/PR --)  /p breast lumpectomy w/ node dissection;  completed radiation 12-17-2021   Mixed urge and stress incontinence    Multinodular thyroid 2016   nontoxic;   incidental finding on CT 2016;   last bx in epic 04-27-2018 benign follicular left nodule;   last ultrasound in care everywhere 06-02-2019   OA (osteoarthritis)    knees, handa, all over   OAB (overactive bladder)    PONV (postoperative nausea and vomiting)    Wears glasses     Past Surgical History:  Procedure Laterality Date   BREAST LUMPECTOMY WITH AXILLARY LYMPH NODE BIOPSY Left 10/15/2021   @[facility]  by dr Bea Laura. Remus Loffler   COLONOSCOPY WITH ESOPHAGOGASTRODUODENOSCOPY (EGD)  01/30/2022   [facility]   DILATATION & CURETTAGE/HYSTEROSCOPY WITH MYOSURE N/A 01/16/2023   Procedure: DILATATION & CURETTAGE/HYSTEROSCOPY WITH POSSIBLE  Jackquline Denmark;  Surgeon: Harold Hedge, MD;  Location: [facility] OR;  Service: Gynecology;  Laterality: N/A;   HYSTEROSCOPY WITH D & C  01/02/2021   @[facility]  by dr t. Roxan Hockey;   polypectomy   TUBAL LIGATION Bilateral    yrs ago    Prior to Admission medications   Medication Sig Start Date End Date Taking? Authorizing Provider  albuterol (VENTOLIN HFA) 108 (90 Base) MCG/ACT inhaler Inhale 2 puffs into the lungs every 6 (six) hours as needed for wheezing or  shortness of breath (allergies).    [provider]  Bioflavonoid Products (ESTER-C PO) Take 1,000 mg by mouth daily.    [provider]  Cholecalciferol (VITAMIN D3) 125 MCG (5000 UT) CHEW Chew by mouth daily.    [provider]  diazepam (VALIUM) 5 MG tablet Take 5 mg by mouth every 6 (six) hours as needed for muscle spasms.     [provider]  fluticasone (FLONASE) 50 MCG/ACT nasal spray Place 1 spray into both nostrils daily as needed for allergies.    [provider]  ibuprofen (ADVIL,MOTRIN) 200 MG tablet Take 200 mg by mouth every 6 (six) hours as needed for moderate pain.    [provider]  loratadine (CLARITIN) 10 MG tablet Take 10 mg by mouth daily as needed for allergies.    [provider]  Magnesium 250 MG TABS Take 250 mg by mouth at bedtime.    [provider]  meloxicam (MOBIC) 15 MG tablet Take 15 mg by mouth daily as needed for pain.    [provider]  Multiple Vitamins-Minerals (HAIR/SKIN/NAILS) CAPS Take 1 capsule by mouth daily after lunch.    [provider]  Multiple Vitamins-Minerals (WOMENS MULTIVITAMIN PO) Take 1 tablet by mouth daily after lunch.    [provider]  nitroGLYCERIN (NITROSTAT) 0.4 MG SL tablet Place 1 tablet (0.4 mg total) under the tongue every 5 (five) minutes as needed for chest pain. 04/04/22 01/14/23  Rollene Rotunda, MD  Omega-3 Fatty Acids (OMEGA-3 FISH OIL PO) Take 1,000 mg by mouth daily after lunch.    [provider]  pantoprazole (PROTONIX) 40 MG tablet Take 40 mg by mouth daily as needed (Heartburn).    [provider]  PRESCRIPTION MEDICATION Per pt injection every 6 months for cholesterol prescribed by Dr Waynard Edwards (PCP)    [provider]  triamcinolone cream (KENALOG) 0.1 % Apply 1 Application topically as needed (Eczema).    [provider]    Allergies  Allergen Reactions   Codeine Nausea Only   Oxycodone  Nausea And Vomiting    Pt states she was "out of it"   Pravastatin Other (See Comments)    Other Reaction(s): Arthralgias   Progesterone Other (See Comments)    Other Reaction(s): severe hot flashes   Rosuvastatin Other (See Comments)    Other Reaction(s): Arthralgias    Social History   Socioeconomic History   Marital status: Married    Spouse name: Sam   Number of children: Not on file   Years of education: Not on file   Highest education level: Not on file  Occupational History   Not on file  Tobacco Use   Smoking status: Former    Current packs/day: 0.00    Types: Cigarettes    Start date: 4    Quit date: 2003    Years since quitting: 21.6   Smokeless tobacco: Never  Vaping Use   Vaping status: Never Used  Substance  and Sexual Activity   Alcohol use: Yes    Comment: occ wine    Drug use: Never   Sexual activity: Not Currently    Birth control/protection: Post-menopausal  Other Topics Concern   Not on file  Social History Narrative   Divorced.   Social Determinants of Health   Financial Resource Strain: Not on file  Food Insecurity: No Food Insecurity (09/18/2021)   Received from [facility], [facility]   Hunger Vital Sign    Worried About Running Out of Food in the Last Year: Never true    Ran Out of Food in the Last Year: Never true  Transportation Needs: Not on file  Physical Activity: Not on file  Stress: Not on file  Social Connections: Unknown (03/21/2022)   Received from [facility], [facility]   Social Network    Social Network: Not on file  Intimate Partner Violence: Unknown (02/11/2022)   Received from [facility], [facility]   HITS    Physically Hurt: Not on file    Insult or Talk Down To: Not on file    Threaten Physical Harm: Not on file    Scream or Curse: Not on file    Tobacco Use: Medium Risk (07/01/2023)   Patient History    Smoking Tobacco Use: Former    Smokeless Tobacco Use: Never    Passive Exposure: Not on  file   Social History   Substance and Sexual Activity  Alcohol Use Yes   Comment: occ wine     Family History  Problem Relation Age of Onset   CVA Mother        Light stroke   Heart disease Mother 37       Stents   Colon cancer Brother 39       2003 started treatments    Colon polyps Brother    Colon polyps Brother    Colon polyps Brother    Ovarian cancer Maternal Aunt    Cancer - Other Maternal Aunt    Liver cancer Maternal Uncle    Thyroid disease Neg Hx    Rectal cancer Neg Hx    Stomach cancer Neg Hx     Review of Systems  Constitutional:  Negative for chills and fever.  HENT: Negative.    Eyes: Negative.   Respiratory:  Negative for cough and shortness of breath.   Cardiovascular:  Negative for chest pain and palpitations.  Gastrointestinal:  Negative for abdominal pain, constipation, diarrhea, nausea and vomiting.  Genitourinary:  Negative for dysuria, frequency and urgency.  Musculoskeletal:  Positive for joint pain.  Skin:  Negative for rash.   Objective:  Physical Exam: Well nourished and well developed.  General: Alert and oriented x3, cooperative and pleasant, no acute distress.  Head: normocephalic, atraumatic, neck supple.  Eyes: EOMI. Abdomen: non-tender to palpation and soft, normoactive bowel sounds. Musculoskeletal: - Hips Normal motion, no discomfort.    - Left knee Varus deformity, range of motion is about 10-100 with crepitus on range of motion. Tender medially with slight lateral tenderness. No instability.    - Right knee Range of motion is 0-120 with crepitus on range of motion. Slight tenderness medially, no lateral tenderness or instability.    - Both knees Varus deformities, worse on the left than the right.  Calves soft and nontender. Motor function intact in LE. Strength 5/5 LE bilaterally. Neuro: Distal pulses 2+. Sensation to light touch intact in LE.  Vital signs in last 24 hours:  BP: ()/()  Arterial Line BP: ()/()    Imaging Review Plain radiographs demonstrate severe degenerative joint disease of the left knee. The overall alignment is significant varus. The bone quality appears to be adequate for age and reported activity level.  Assessment/Plan:  End stage arthritis, left knee   The patient history, physical examination, clinical judgment of the provider and imaging studies are consistent with end stage degenerative joint disease of the left knee and total knee arthroplasty is deemed medically necessary. The treatment options including medical management, injection therapy arthroscopy and arthroplasty were discussed at length. The risks and benefits of total knee arthroplasty were presented and reviewed. The risks due to aseptic loosening, infection, stiffness, patella tracking problems, thromboembolic complications and other imponderables were discussed. The patient acknowledged the explanation, agreed to proceed with the plan and consent was signed. Patient is being admitted for inpatient treatment for surgery, pain control, PT, OT, prophylactic antibiotics, VTE prophylaxis, progressive ambulation and ADLs and discharge planning. The patient is planning to be discharged  home .  Patient's anticipated LOS is less than 2 midnights, meeting these requirements: - Lives within 1 hour of care - Has a competent adult at home to recover with post-op - NO history of  - Chronic pain requiring opiods  - Diabetes  - Coronary Artery Disease  - Heart failure  - Heart attack  - Stroke  - DVT/VTE  - Cardiac arrhythmia  - Respiratory Failure/COPD  - Renal failure  - Anemia  - Advanced Liver disease  Therapy Plans: EO Disposition: Home with Husband Planned DVT Prophylaxis: Xarelto 10 mg BID (hx breast cancer) DME Needed: None PCP: Rodrigo Ran, MD (clearance received) Cardiologist: Rollene Rotunda, MD (clearance received) TXA: IV Allergies: codeine (nausea, hallucinations), tramadol (nausea) Anesthesia  Concerns: N/V BMI: 38.1 Last HgbA1c: not diabetic  Pharmacy: [facility])  Other: -Requesting right knee cortisone injection intra-op -does not tolerate oxycodone or tramadol - discussed dilaudid post-op  - Patient was instructed on what medications to stop prior to surgery. - Follow-up visit in 2 weeks with Dr. Lequita Halt - Begin physical therapy following surgery - Pre-operative lab work as pre-surgical testing - Prescriptions will be provided in hospital at time of discharge  R. Arcola Jansky, PA-C Orthopedic Surgery [facility] [place]

## 2023-07-22 ENCOUNTER — Encounter (HOSPITAL_COMMUNITY): Payer: Self-pay

## 2023-07-22 NOTE — Patient Instructions (Addendum)
SURGICAL WAITING ROOM VISITATION Patients having surgery or a procedure may have no more than 2 support people in the waiting area - these visitors may rotate.    Children under the age of 38 must have an adult with them who is not the patient.  If the patient needs to stay at the hospital during part of their recovery, the visitor guidelines for inpatient rooms apply. Pre-op nurse will coordinate an appropriate time for 1 support person to accompany patient in pre-op.  This support person may not rotate.    Please refer to the [facility] website for the visitor guidelines for Inpatients (after your surgery is over and you are in a regular room).       Your procedure is scheduled on: 08-03-23   Report to [facility] Main Entrance    Report to admitting at 8:00 AM   Call this number if you have problems the morning of surgery [phone]   Do not eat food :After Midnight.   After Midnight you may have the following liquids until 7:30 AM DAY OF SURGERY  Water Non-Citrus Juices (without pulp, NO RED-Apple, White grape, White cranberry) Black Coffee (NO MILK/CREAM OR CREAMERS, sugar ok)  Clear Tea (NO MILK/CREAM OR CREAMERS, sugar ok) regular and decaf                             Plain Jell-O (NO RED)                                           Fruit ices (not with fruit pulp, NO RED)                                     Popsicles (NO RED)                                                               Sports drinks like Gatorade (NO RED)                   The day of surgery:  Drink ONE (1) Pre-Surgery Clear Ensure by 7:30 AM the morning of surgery. Drink in one sitting. Do not sip.  This drink was given to you during your hospital  pre-op appointment visit. Nothing else to drink after completing the Pre-Surgery Clear Ensure.          If you have questions, please contact your surgeon's office.   FOLLOW ANY ADDITIONAL PRE OP INSTRUCTIONS YOU RECEIVED FROM YOUR SURGEON'S  OFFICE!!!     Oral Hygiene is also important to reduce your risk of infection.                                    Remember - BRUSH YOUR TEETH THE MORNING OF SURGERY WITH YOUR REGULAR TOOTHPASTE   Do NOT smoke after Midnight   Take these medicines the morning of surgery with A SIP OF WATER:   Claritin  Pantoprazole  If needed  Tylenol, Diazepam, Nitroglycerin  Stop all vitamins and herbal supplements 7 days before surgery  Bring CPAP mask and tubing day of surgery.                              You may not have any metal on your body including hair pins, jewelry, and body piercing             Do not wear make-up, lotions, powders, perfumes or deodorant  Do not wear nail polish including gel and S&S, artificial/acrylic nails, or any other type of covering on natural nails including finger and toenails. If you have artificial nails, gel coating, etc. that needs to be removed by a nail salon please have this removed prior to surgery or surgery may need to be canceled/ delayed if the surgeon/ anesthesia feels like they are unable to be safely monitored.   Do not shave  48 hours prior to surgery.      Do not bring valuables to the hospital. [facility] IS NOT RESPONSIBLE   FOR VALUABLES.   Contacts, dentures or bridgework may not be worn into surgery.   Bring small overnight bag day of surgery.   DO NOT BRING YOUR HOME MEDICATIONS TO THE HOSPITAL. PHARMACY WILL DISPENSE MEDICATIONS LISTED ON YOUR MEDICATION LIST TO YOU DURING YOUR ADMISSION IN THE HOSPITAL!   Special Instructions: Bring a copy of your healthcare power of attorney and living will documents the day of surgery if you haven't scanned them before.              Please read over the following fact sheets you were given: IF YOU HAVE QUESTIONS ABOUT YOUR PRE-OP INSTRUCTIONS PLEASE CALL [phone] [facility]  If you received a COVID test during your pre-op visit  it is requested that you wear a mask when out in public, stay away from  anyone that may not be feeling well and notify your surgeon if you develop symptoms. If you test positive for Covid or have been in contact with anyone that has tested positive in the last 10 days please notify you surgeon.    Pre-operative 5 CHG Bath Instructions   You can play a key role in reducing the risk of infection after surgery. Your skin needs to be as free of germs as possible. You can reduce the number of germs on your skin by washing with CHG (chlorhexidine gluconate) soap before surgery. CHG is an antiseptic soap that kills germs and continues to kill germs even after washing.   DO NOT use if you have an allergy to chlorhexidine/CHG or antibacterial soaps. If your skin becomes reddened or irritated, stop using the CHG and notify one of our RNs at [phone].   Please shower with the CHG soap starting 4 days before surgery using the following schedule:     Please keep in mind the following:  DO NOT shave, including legs and underarms, starting the day of your first shower.   You may shave your face at any point before/day of surgery.  Place clean sheets on your bed the day you start using CHG soap. Use a clean washcloth (not used since being washed) for each shower. DO NOT sleep with pets once you start using the CHG.   CHG Shower Instructions:  If you choose to wash your hair and private area, wash first with your normal shampoo/soap.  After you use shampoo/soap, rinse your hair and body thoroughly  to remove shampoo/soap residue.  Turn the water OFF and apply about 3 tablespoons (45 ml) of CHG soap to a CLEAN washcloth.  Apply CHG soap ONLY FROM YOUR NECK DOWN TO YOUR TOES (washing for 3-5 minutes)  DO NOT use CHG soap on face, private areas, open wounds, or sores.  Pay special attention to the area where your surgery is being performed.  If you are having back surgery, having someone wash your back for you may be helpful. Wait 2 minutes after CHG soap is applied, then  you may rinse off the CHG soap.  Pat dry with a clean towel  Put on clean clothes/pajamas   If you choose to wear lotion, please use ONLY the CHG-compatible lotions on the back of this paper.     Additional instructions for the day of surgery: DO NOT APPLY any lotions, deodorants, cologne, or perfumes.   Put on clean/comfortable clothes.  Brush your teeth.  Ask your nurse before applying any prescription medications to the skin.      CHG Compatible Lotions   Aveeno Moisturizing lotion  Cetaphil Moisturizing Cream  Cetaphil Moisturizing Lotion  Clairol Herbal Essence Moisturizing Lotion, Dry Skin  Clairol Herbal Essence Moisturizing Lotion, Extra Dry Skin  Clairol Herbal Essence Moisturizing Lotion, Normal Skin  Curel Age Defying Therapeutic Moisturizing Lotion with Alpha Hydroxy  Curel Extreme Care Body Lotion  Curel Soothing Hands Moisturizing Hand Lotion  Curel Therapeutic Moisturizing Cream, Fragrance-Free  Curel Therapeutic Moisturizing Lotion, Fragrance-Free  Curel Therapeutic Moisturizing Lotion, Original Formula  Eucerin Daily Replenishing Lotion  Eucerin Dry Skin Therapy Plus Alpha Hydroxy Crme  Eucerin Dry Skin Therapy Plus Alpha Hydroxy Lotion  Eucerin Original Crme  Eucerin Original Lotion  Eucerin Plus Crme Eucerin Plus Lotion  Eucerin TriLipid Replenishing Lotion  Keri Anti-Bacterial Hand Lotion  Keri Deep Conditioning Original Lotion Dry Skin Formula Softly Scented  Keri Deep Conditioning Original Lotion, Fragrance Free Sensitive Skin Formula  Keri Lotion Fast Absorbing Fragrance Free Sensitive Skin Formula  Keri Lotion Fast Absorbing Softly Scented Dry Skin Formula  Keri Original Lotion  Keri Skin Renewal Lotion Keri Silky Smooth Lotion  Keri Silky Smooth Sensitive Skin Lotion  Nivea Body Creamy Conditioning Oil  Nivea Body Extra Enriched Lotion  Nivea Body Original Lotion  Nivea Body Sheer Moisturizing Lotion Nivea Crme  Nivea Skin Firming Lotion   NutraDerm 30 Skin Lotion  NutraDerm Skin Lotion  NutraDerm Therapeutic Skin Cream  NutraDerm Therapeutic Skin Lotion  ProShield Protective Hand Cream  Provon moisturizing lotion   PATIENT SIGNATURE_________________________________  NURSE SIGNATURE__________________________________  ________________________________________________________________________    Rogelia Mire  An incentive spirometer is a tool that can help keep your lungs clear and active. This tool measures how well you are filling your lungs with each breath. Taking long deep breaths may help reverse or decrease the chance of developing breathing (pulmonary) problems (especially infection) following: A long period of time when you are unable to move or be active. BEFORE THE PROCEDURE  If the spirometer includes an indicator to show your best effort, your nurse or respiratory therapist will set it to a desired goal. If possible, sit up straight or lean slightly forward. Try not to slouch. Hold the incentive spirometer in an upright position. INSTRUCTIONS FOR USE  Sit on the edge of your bed if possible, or sit up as far as you can in bed or on a chair. Hold the incentive spirometer in an upright position. Breathe out normally. Place the mouthpiece in your mouth and  seal your lips tightly around it. Breathe in slowly and as deeply as possible, raising the piston or the ball toward the top of the column. Hold your breath for 3-5 seconds or for as long as possible. Allow the piston or ball to fall to the bottom of the column. Remove the mouthpiece from your mouth and breathe out normally. Rest for a few seconds and repeat Steps 1 through 7 at least 10 times every 1-2 hours when you are awake. Take your time and take a few normal breaths between deep breaths. The spirometer may include an indicator to show your best effort. Use the indicator as a goal to work toward during each repetition. After each set of 10 deep  breaths, practice coughing to be sure your lungs are clear. If you have an incision (the cut made at the time of surgery), support your incision when coughing by placing a pillow or rolled up towels firmly against it. Once you are able to get out of bed, walk around indoors and cough well. You may stop using the incentive spirometer when instructed by your caregiver.  RISKS AND COMPLICATIONS Take your time so you do not get dizzy or light-headed. If you are in pain, you may need to take or ask for pain medication before doing incentive spirometry. It is harder to take a deep breath if you are having pain. AFTER USE Rest and breathe slowly and easily. It can be helpful to keep track of a log of your progress. Your caregiver can provide you with a simple table to help with this. If you are using the spirometer at home, follow these instructions: SEEK MEDICAL CARE IF:  You are having difficultly using the spirometer. You have trouble using the spirometer as often as instructed. Your pain medication is not giving enough relief while using the spirometer. You develop fever of 100.5 F (38.1 C) or higher. SEEK IMMEDIATE MEDICAL CARE IF:  You cough up bloody sputum that had not been present before. You develop fever of 102 F (38.9 C) or greater. You develop worsening pain at or near the incision site. MAKE SURE YOU:  Understand these instructions. Will watch your condition. Will get help right away if you are not doing well or get worse. Document Released: 03/09/2007 Document Revised: 01/19/2012 Document Reviewed: 05/10/2007 [facility] Patient Information 2014 [place], [place].   ________________________________________________________________________

## 2023-07-22 NOTE — Progress Notes (Signed)
COVID Vaccine Completed:  Yes  Date of COVID positive in last 90 days:  PCP - Rodrigo Ran, MD Cardiologist - Rollene Rotunda, MD  Cardiac clearance in Epic dated 07-01-23 by Robin Searing, NP  Chest x-ray -  EKG -  Stress Test -  ECHO -  Cardiac Cath -  Pacemaker/ICD device last checked: Spinal Cord Stimulator: Cardiac CT - 02-11-22 Epic  Bowel Prep -   Sleep Study -  CPAP -   Fasting Blood Sugar -  Checks Blood Sugar _____ times a day  Last dose of GLP1 agonist-  N/A GLP1 instructions:  N/A   Last dose of SGLT-2 inhibitors-  N/A SGLT-2 instructions: N/A   Blood Thinner Instructions:  Time Aspirin Instructions: Last Dose:  Activity level:  Can go up a flight of stairs and perform activities of daily living without stopping and without symptoms of chest pain or shortness of breath.  Able to exercise without symptoms  Unable to go up a flight of stairs without symptoms of     Anesthesia review:  Elevated calcium score followed by cardiology.  Patient denies shortness of breath, fever, cough and chest pain at PAT appointment  Patient verbalized understanding of instructions that were given to them at the PAT appointment. Patient was also instructed that they will need to review over the PAT instructions again at home before surgery.

## 2023-07-23 ENCOUNTER — Encounter (HOSPITAL_COMMUNITY)
Admission: RE | Admit: 2023-07-23 | Discharge: 2023-07-23 | Disposition: A | Discharge status: Home / Self Care | Payer: 59 | Source: Ambulatory Visit | Attending: Orthopedic Surgery | Admitting: Orthopedic Surgery

## 2023-07-23 ENCOUNTER — Other Ambulatory Visit: Payer: Self-pay

## 2023-07-23 ENCOUNTER — Encounter (HOSPITAL_COMMUNITY): Payer: Self-pay

## 2023-07-23 VITALS — BP 137/79 | HR 101 | Temp 98.4°F | Resp 16 | Ht 61.5 in | Wt 200.0 lb

## 2023-07-23 DIAGNOSIS — I2510 Atherosclerotic heart disease of native coronary artery without angina pectoris: Secondary | ICD-10-CM | POA: Diagnosis not present

## 2023-07-23 DIAGNOSIS — Z01818 Encounter for other preprocedural examination: Secondary | ICD-10-CM

## 2023-07-23 DIAGNOSIS — Z01810 Encounter for preprocedural cardiovascular examination: Secondary | ICD-10-CM | POA: Insufficient documentation

## 2023-07-23 DIAGNOSIS — Z01812 Encounter for preprocedural laboratory examination: Secondary | ICD-10-CM | POA: Insufficient documentation

## 2023-07-23 HISTORY — DX: Personal history of urinary calculi: Z87.442

## 2023-07-23 HISTORY — DX: Abnormal findings on diagnostic imaging of heart and coronary circulation: R93.1

## 2023-07-23 LAB — BASIC METABOLIC PANEL
Anion gap: 11 (ref 5–15)
BUN: 16 mg/dL (ref 8–23)
CO2: 23 mmol/L (ref 22–32)
Calcium: 9.6 mg/dL (ref 8.9–10.3)
Chloride: 105 mmol/L (ref 98–111)
Creatinine, Ser: 0.56 mg/dL (ref 0.44–1.00)
GFR, Estimated: >60 mL/min (ref >60)
Glucose, Bld: 111 mg/dL — ABNORMAL HIGH (ref 70–99)
Potassium: 4.3 mmol/L (ref 3.5–5.1)
Sodium: 139 mmol/L (ref 135–145)

## 2023-07-23 LAB — SURGICAL PCR SCREEN
MRSA, PCR: NEGATIVE
Staphylococcus aureus: NEGATIVE

## 2023-07-23 LAB — CBC
HCT: 45.1 % (ref 36.0–46.0)
Hemoglobin: 13.7 g/dL (ref 12.0–15.0)
MCH: 27.4 pg (ref 26.0–34.0)
MCHC: 30.4 g/dL (ref 30.0–36.0)
MCV: 90.2 fL (ref 80.0–100.0)
Platelets: 242 10*3/uL (ref 150–400)
RBC: 5 MIL/uL (ref 3.87–5.11)
RDW: 14.2 % (ref 11.5–15.5)
WBC: 6.2 10*3/uL (ref 4.0–10.5)
nRBC: 0 % (ref 0.0–0.2)

## 2023-07-28 NOTE — Progress Notes (Addendum)
Anesthesia Chart Review   Case: [id number] Date/Time: 08/03/23 1015   Procedure: TOTAL KNEE ARTHROPLASTY (Left: Knee)   Anesthesia type: Choice   Pre-op diagnosis: left knee osteoarthritis   Location: [facility] ROOM 10 / [facility]   Surgeons: Ollen Gross, MD       DISCUSSION:67 y.o. former smoker with h/o PONV, left knee OA scheduled for above procedure 08/03/2023 with Dr. Ollen Gross.   Pt seen by cardiology 07/01/23 for preoperative evaluation.  Per OV note, "Patient's RCRI score is 0.9% The patient affirms she has been doing well without any new cardiac symptoms. They are able to achieve 4 METS without cardiac limitations. Therefore, based on ACC/AHA guidelines, the patient would be at acceptable risk for the planned procedure without further cardiovascular testing. The patient was advised that if she develops new symptoms prior to surgery to contact our office to arrange for a follow-up visit, and she verbalized understanding. "  Clearance from PCP on chart which states pt is optimized for planned procedure.   Per previous anesthesia notes difficult intubation due to limited mouth opening.  Per notes, "Comment: Previous intubation note: "DL x 1 with Miller 3. Patient has very small mouth opening and limited ROM of neck and jaw. Unable gain any type of view except tip of epiglottis. Glidescope called to the room. VL x 1 with glidescope, still remains a very anterior view, Dr. Jaynie Collins applying tracheal pressure and manipulation and I was able to get ETT to glottic opening. Stylet removed and ETT twisted into place. MOP to cuff. +/=BBS +ETCO2 present Mouth unchanged/no trauma noted". She has also been fiberoptically intubated"  VS: BP 137/79   Pulse (!) 101   Temp 36.9 C (Oral)   Resp 16   Ht 5' 1.5" (1.562 m)   Wt 90.7 kg   SpO2 100%   BMI 37.18 kg/m   PROVIDERS: Rodrigo Ran, MD is PCP   Cardiologist - Rollene Rotunda, MD  LABS: Labs reviewed: Acceptable for surgery. (all labs ordered are  listed, but only abnormal results are displayed)  Labs Reviewed  BASIC METABOLIC PANEL - Abnormal; Notable for the following components:      Result Value   Glucose, Bld 111 (*)    All other components within normal limits  SURGICAL PCR SCREEN  CBC     IMAGES:   EKG:   CV:  Past Medical History:  Diagnosis Date   Abnormal uterine bleeding (AUB)    Chronic constipation    Complication of anesthesia    01-13-2023  per pt she has small throat and mouth request use of very small tube since she has had issue previously with severe throat pain from tube put on prednisone , and difficult IV stick due to small veins   Coronary artery calcification seen on CAT scan 02/2022   cardiologist--- dr Antoine Poche;   Difficult intravenous access    per pt has previous issue with anyone getting IV,  told has small veins   Difficult intubation    01-14-2023  reviewed chart w/ Dr Curt Jews and w/ previous anesthesia record from surgery @ [facility] 10-15-2021 which shows diffucult intubation, pt not candidate for ambulatory surgery center   Eczema    Elevated coronary artery calcium score    Family history of adverse reaction to anesthesia    brother---   ponv   GERD (gastroesophageal reflux disease)    Hemorrhoids    external   History of adenomatous polyp of colon    History of  external beam radiation therapy    left breast  11-27-2021  to  12-17-2021   History of Helicobacter pylori infection 01/30/2022   EGD bx --- per pt treated   History of kidney stones    Malignant neoplasm of central portion of left breast in female, estrogen receptor negative (HCC) 09/2021   oncology--- dr a hensely/ oncology surgeon-- dr Rachael Fee;   dx 10-08-2021;   10-15-2021 , Stage II  invasive metaplastic carcinoma (ER/PR --)  /p breast lumpectomy w/ node dissection;  completed radiation 12-17-2021   Mixed urge and stress incontinence    Multinodular thyroid 2016   nontoxic;   incidental finding on CT 2016;   last  bx in epic 04-27-2018 benign follicular left nodule;   last ultrasound in care everywhere 06-02-2019   Multiple thyroid nodules    OA (osteoarthritis)    knees, handa, all over   OAB (overactive bladder)    PONV (postoperative nausea and vomiting)    Wears glasses     Past Surgical History:  Procedure Laterality Date   BREAST LUMPECTOMY WITH AXILLARY LYMPH NODE BIOPSY Left 10/15/2021   @[facility]  by dr Bea Laura. Remus Loffler   COLONOSCOPY WITH ESOPHAGOGASTRODUODENOSCOPY (EGD)  01/30/2022   [place] [facility]   DILATATION & CURETTAGE/HYSTEROSCOPY WITH MYOSURE N/A 01/16/2023   Procedure: DILATATION & CURETTAGE/HYSTEROSCOPY WITH POSSIBLE MYOSURE;  Surgeon: Harold Hedge, MD;  Location: [facility] OR;  Service: Gynecology;  Laterality: N/A;   HYSTEROSCOPY WITH D & C  01/02/2021   @[facility]  by dr t. Roxan Hockey;   polypectomy   TUBAL LIGATION Bilateral    yrs ago    MEDICATIONS:  acetaminophen (TYLENOL) 500 MG tablet   albuterol (VENTOLIN HFA) 108 (90 Base) MCG/ACT inhaler   Bioflavonoid Products (ESTER-C PO)   Cholecalciferol (VITAMIN D3) 125 MCG (5000 UT) CHEW   diazepam (VALIUM) 5 MG tablet   fluticasone (FLONASE) 50 MCG/ACT nasal spray   loratadine (CLARITIN) 10 MG tablet   Magnesium 250 MG TABS   meloxicam (MOBIC) 15 MG tablet   Multiple Vitamins-Minerals (HAIR/SKIN/NAILS) CAPS   Multiple Vitamins-Minerals (WOMENS MULTIVITAMIN PO)   nitroGLYCERIN (NITROSTAT) 0.4 MG SL tablet   Omega-3 Fatty Acids (OMEGA-3 FISH OIL PO)   pantoprazole (PROTONIX) 40 MG tablet   PRESCRIPTION MEDICATION   PROCTO-MED HC 2.5 % rectal cream   triamcinolone cream (KENALOG) 0.1 %   No current facility-administered medications for this encounter.      Jodell Cipro Ward, PA-C [facility] [phone]

## 2023-07-28 NOTE — Anesthesia Preprocedure Evaluation (Addendum)
Anesthesia Evaluation  Patient identified by MRN, date of birth, ID band Patient awake    Reviewed: Allergy & Precautions, NPO status , Patient's Chart, lab work & pertinent test results  History of Anesthesia Complications (+) PONV, DIFFICULT AIRWAY and history of anesthetic complications (difficult PIV)  Airway Mallampati: IV  TM Distance: >3 FB Neck ROM: Full    Dental  (+) Teeth Intact, Dental Advisory Given   Pulmonary former smoker   Pulmonary exam normal breath sounds clear to auscultation       Cardiovascular negative cardio ROS Normal cardiovascular exam Rhythm:Regular Rate:Normal     Neuro/Psych negative neurological ROS  negative psych ROS   GI/Hepatic Neg liver ROS,GERD  Medicated and Controlled,,  Endo/Other  Obesity BMI 37  Renal/GU negative Renal ROS  negative genitourinary   Musculoskeletal  (+) Arthritis , Osteoarthritis,    Abdominal  (+) + obese  Peds  Hematology negative hematology ROS (+) Hb 13.7, plt 242   Anesthesia Other Findings   Reproductive/Obstetrics negative OB ROS                             Anesthesia Physical Anesthesia Plan  ASA: 3  Anesthesia Plan: Spinal, MAC and Regional   Post-op Pain Management: Tylenol PO (pre-op)*   Induction:   PONV Risk Score and Plan: 2 and Propofol infusion and TIVA  Airway Management Planned: Natural Airway and Nasal Cannula  Additional Equipment: None  Intra-op Plan:   Post-operative Plan:   Informed Consent: I have reviewed the patients History and Physical, chart, labs and discussed the procedure including the risks, benefits and alternatives for the proposed anesthesia with the patient or authorized representative who has indicated his/her understanding and acceptance.       Plan Discussed with: CRNA  Anesthesia Plan Comments: (Airway note from OSH 2022: "DL x 1 with Miller 3. Patient has very small  mouth opening and limited ROM of neck and jaw. Unable gain any type of view except tip of epiglottis. Glidescope called to the room. VL x 1 with glidescope, still remains a very anterior view, Dr. Jaynie Collins applying tracheal pressure and manipulation and I was able to get ETT to glottic opening. )       Anesthesia Quick Evaluation

## 2023-08-03 ENCOUNTER — Other Ambulatory Visit: Payer: Self-pay

## 2023-08-03 ENCOUNTER — Encounter (HOSPITAL_COMMUNITY)
Admission: RE | Disposition: A | Discharge status: Home / Self Care | Payer: Self-pay | Source: Home / Self Care | Attending: Orthopedic Surgery

## 2023-08-03 ENCOUNTER — Ambulatory Visit (HOSPITAL_BASED_OUTPATIENT_CLINIC_OR_DEPARTMENT_OTHER): Payer: 59 | Admitting: Anesthesiology

## 2023-08-03 ENCOUNTER — Encounter (HOSPITAL_COMMUNITY): Payer: Self-pay | Admitting: Orthopedic Surgery

## 2023-08-03 ENCOUNTER — Ambulatory Visit (HOSPITAL_COMMUNITY): Payer: 59 | Admitting: Physician Assistant

## 2023-08-03 ENCOUNTER — Observation Stay (HOSPITAL_COMMUNITY)
Admission: RE | Admit: 2023-08-03 | Discharge: 2023-08-05 | Disposition: A | Discharge status: Home / Self Care | Payer: 59 | Attending: Orthopedic Surgery | Admitting: Orthopedic Surgery

## 2023-08-03 DIAGNOSIS — I2584 Coronary atherosclerosis due to calcified coronary lesion: Secondary | ICD-10-CM | POA: Diagnosis not present

## 2023-08-03 DIAGNOSIS — Z853 Personal history of malignant neoplasm of breast: Secondary | ICD-10-CM | POA: Insufficient documentation

## 2023-08-03 DIAGNOSIS — M170 Bilateral primary osteoarthritis of knee: Principal | ICD-10-CM | POA: Insufficient documentation

## 2023-08-03 DIAGNOSIS — Z87891 Personal history of nicotine dependence: Secondary | ICD-10-CM | POA: Diagnosis not present

## 2023-08-03 DIAGNOSIS — M1712 Unilateral primary osteoarthritis, left knee: Secondary | ICD-10-CM | POA: Diagnosis present

## 2023-08-03 DIAGNOSIS — M179 Osteoarthritis of knee, unspecified: Principal | ICD-10-CM | POA: Diagnosis present

## 2023-08-03 HISTORY — PX: INJECTION KNEE: SHX2446

## 2023-08-03 HISTORY — PX: TOTAL KNEE ARTHROPLASTY: SHX125

## 2023-08-03 SURGERY — ARTHROPLASTY, KNEE, TOTAL
Anesthesia: Monitor Anesthesia Care | Site: Knee | Laterality: Right

## 2023-08-03 MED ORDER — METOCLOPRAMIDE HCL 5 MG/ML IJ SOLN
5.0000 mg | Freq: Three times a day (TID) | INTRAMUSCULAR | Status: DC | PRN
  Administered 2023-08-03 – 2023-08-05 (×3): 10 mg via INTRAVENOUS
  Filled 2023-08-03 (×3): qty 2

## 2023-08-03 MED ORDER — HYDROMORPHONE HCL 2 MG PO TABS
2.0000 mg | ORAL_TABLET | ORAL | Status: DC | PRN
  Administered 2023-08-03 – 2023-08-05 (×7): 2 mg via ORAL
  Filled 2023-08-03 (×7): qty 1

## 2023-08-03 MED ORDER — HYDROMORPHONE HCL 1 MG/ML IJ SOLN
0.5000 mg | INTRAMUSCULAR | Status: DC | PRN
  Administered 2023-08-03 – 2023-08-04 (×3): 1 mg via INTRAVENOUS
  Filled 2023-08-03 (×4): qty 1

## 2023-08-03 MED ORDER — ONDANSETRON HCL 4 MG/2ML IJ SOLN
INTRAMUSCULAR | Status: DC | PRN
  Administered 2023-08-03: 4 mg via INTRAVENOUS

## 2023-08-03 MED ORDER — GABAPENTIN 300 MG PO CAPS
300.0000 mg | ORAL_CAPSULE | Freq: Three times a day (TID) | ORAL | Status: DC
  Administered 2023-08-03 – 2023-08-05 (×6): 300 mg via ORAL
  Filled 2023-08-03 (×6): qty 1

## 2023-08-03 MED ORDER — TRIAMCINOLONE ACETONIDE 0.1 % EX CREA: 1.0000 | TOPICAL_CREAM | Freq: Every day | CUTANEOUS | Status: DC | PRN

## 2023-08-03 MED ORDER — ROPIVACAINE HCL 5 MG/ML IJ SOLN
INTRAMUSCULAR | Status: DC | PRN
Start: 2023-08-03 — End: 2023-08-03
  Administered 2023-08-03: 30 mL via PERINEURAL

## 2023-08-03 MED ORDER — SODIUM CHLORIDE (PF) 0.9 % IJ SOLN
INTRAMUSCULAR | Status: AC
  Filled 2023-08-03: qty 50

## 2023-08-03 MED ORDER — METHOCARBAMOL 500 MG PO TABS
500.0000 mg | ORAL_TABLET | Freq: Four times a day (QID) | ORAL | Status: DC | PRN
  Administered 2023-08-04 – 2023-08-05 (×4): 500 mg via ORAL
  Filled 2023-08-03 (×4): qty 1

## 2023-08-03 MED ORDER — HYDROMORPHONE HCL 1 MG/ML IJ SOLN: 0.5000 mg | INTRAMUSCULAR | Status: DC | PRN

## 2023-08-03 MED ORDER — ORAL CARE MOUTH RINSE: 15.0000 mL | Freq: Once | OROMUCOSAL | Status: AC

## 2023-08-03 MED ORDER — ACETAMINOPHEN 500 MG PO TABS
1000.0000 mg | ORAL_TABLET | Freq: Four times a day (QID) | ORAL | Status: AC
  Administered 2023-08-04 (×2): 1000 mg via ORAL
  Filled 2023-08-03 (×2): qty 2

## 2023-08-03 MED ORDER — ONDANSETRON HCL 4 MG PO TABS
4.0000 mg | ORAL_TABLET | Freq: Four times a day (QID) | ORAL | Status: DC | PRN
  Filled 2023-08-03: qty 1

## 2023-08-03 MED ORDER — PROPOFOL 500 MG/50ML IV EMUL
INTRAVENOUS | Status: DC | PRN
  Administered 2023-08-03: 40 ug/kg/min via INTRAVENOUS

## 2023-08-03 MED ORDER — PANTOPRAZOLE SODIUM 40 MG PO TBEC: 40.0000 mg | DELAYED_RELEASE_TABLET | Freq: Every day | ORAL | Status: DC | PRN

## 2023-08-03 MED ORDER — POVIDONE-IODINE 10 % EX SWAB
2.0000 | Freq: Once | CUTANEOUS | Status: AC
  Administered 2023-08-03: 2 via TOPICAL

## 2023-08-03 MED ORDER — PROPOFOL 1000 MG/100ML IV EMUL
INTRAVENOUS | Status: AC
  Filled 2023-08-03: qty 100

## 2023-08-03 MED ORDER — ACETAMINOPHEN 500 MG PO TABS: 1000.0000 mg | ORAL_TABLET | Freq: Once | ORAL | Status: DC

## 2023-08-03 MED ORDER — METHOCARBAMOL 1000 MG/10ML IJ SOLN
500.0000 mg | Freq: Four times a day (QID) | INTRAVENOUS | Status: DC | PRN
  Filled 2023-08-03: qty 5

## 2023-08-03 MED ORDER — BISACODYL 10 MG RE SUPP: 10.0000 mg | Freq: Every day | RECTAL | Status: DC | PRN

## 2023-08-03 MED ORDER — BUPIVACAINE LIPOSOME 1.3 % IJ SUSP: 20.0000 mL | Freq: Once | INTRAMUSCULAR | Status: DC

## 2023-08-03 MED ORDER — RIVAROXABAN 10 MG PO TABS
10.0000 mg | ORAL_TABLET | Freq: Every day | ORAL | Status: DC
  Administered 2023-08-04 – 2023-08-05 (×2): 10 mg via ORAL
  Filled 2023-08-03 (×2): qty 1

## 2023-08-03 MED ORDER — DOCUSATE SODIUM 100 MG PO CAPS
100.0000 mg | ORAL_CAPSULE | Freq: Two times a day (BID) | ORAL | Status: DC
  Administered 2023-08-03 – 2023-08-05 (×4): 100 mg via ORAL
  Filled 2023-08-03 (×4): qty 1

## 2023-08-03 MED ORDER — STERILE WATER FOR IRRIGATION IR SOLN
Status: DC | PRN
  Administered 2023-08-03: 2000 mL

## 2023-08-03 MED ORDER — DEXAMETHASONE SODIUM PHOSPHATE 10 MG/ML IJ SOLN
INTRAMUSCULAR | Status: DC | PRN
Start: 2023-08-03 — End: 2023-08-03
  Administered 2023-08-03: 10 mg

## 2023-08-03 MED ORDER — DEXAMETHASONE SODIUM PHOSPHATE 10 MG/ML IJ SOLN
8.0000 mg | Freq: Once | INTRAMUSCULAR | Status: AC
  Administered 2023-08-03: 8 mg via INTRAVENOUS

## 2023-08-03 MED ORDER — TRIAMCINOLONE ACETONIDE 40 MG/ML IJ SUSP
INTRAMUSCULAR | Status: AC
  Filled 2023-08-03: qty 1

## 2023-08-03 MED ORDER — LACTATED RINGERS IV SOLN: INTRAVENOUS | Status: DC

## 2023-08-03 MED ORDER — MIDAZOLAM HCL 2 MG/2ML IJ SOLN
1.0000 mg | INTRAMUSCULAR | Status: AC
  Administered 2023-08-03: 2 mg via INTRAVENOUS
  Filled 2023-08-03: qty 2

## 2023-08-03 MED ORDER — PHENOL 1.4 % MT LIQD: 1.0000 | OROMUCOSAL | Status: DC | PRN

## 2023-08-03 MED ORDER — BUPIVACAINE IN DEXTROSE 0.75-8.25 % IT SOLN
INTRATHECAL | Status: DC | PRN
  Administered 2023-08-03: 1.6 mL via INTRATHECAL

## 2023-08-03 MED ORDER — FENTANYL CITRATE PF 50 MCG/ML IJ SOSY
50.0000 ug | PREFILLED_SYRINGE | INTRAMUSCULAR | Status: AC
  Administered 2023-08-03: 50 ug via INTRAVENOUS
  Filled 2023-08-03: qty 2

## 2023-08-03 MED ORDER — NITROGLYCERIN 0.4 MG SL SUBL: 0.4000 mg | SUBLINGUAL_TABLET | SUBLINGUAL | Status: DC | PRN

## 2023-08-03 MED ORDER — ACETAMINOPHEN 10 MG/ML IV SOLN
1000.0000 mg | Freq: Once | INTRAVENOUS | Status: AC
  Administered 2023-08-03: 1000 mg via INTRAVENOUS
  Filled 2023-08-03: qty 100

## 2023-08-03 MED ORDER — DEXAMETHASONE SODIUM PHOSPHATE 10 MG/ML IJ SOLN
10.0000 mg | Freq: Once | INTRAMUSCULAR | Status: AC
  Administered 2023-08-04: 10 mg via INTRAVENOUS
  Filled 2023-08-03: qty 1

## 2023-08-03 MED ORDER — SODIUM CHLORIDE 0.9 % IV SOLN
INTRAVENOUS | Status: DC | PRN
  Administered 2023-08-03: 80 mL

## 2023-08-03 MED ORDER — CHLORHEXIDINE GLUCONATE 0.12 % MT SOLN
15.0000 mL | Freq: Once | OROMUCOSAL | Status: AC
  Administered 2023-08-03: 15 mL via OROMUCOSAL

## 2023-08-03 MED ORDER — METOCLOPRAMIDE HCL 5 MG PO TABS: 5.0000 mg | ORAL_TABLET | Freq: Three times a day (TID) | ORAL | Status: DC | PRN

## 2023-08-03 MED ORDER — LORATADINE 10 MG PO TABS: 10.0000 mg | ORAL_TABLET | Freq: Every day | ORAL | Status: DC | PRN

## 2023-08-03 MED ORDER — DEXAMETHASONE SODIUM PHOSPHATE 10 MG/ML IJ SOLN
INTRAMUSCULAR | Status: AC
  Filled 2023-08-03: qty 1

## 2023-08-03 MED ORDER — DIPHENHYDRAMINE HCL 12.5 MG/5ML PO ELIX: 12.5000 mg | ORAL_SOLUTION | ORAL | Status: DC | PRN

## 2023-08-03 MED ORDER — TRANEXAMIC ACID-NACL 1000-0.7 MG/100ML-% IV SOLN
1000.0000 mg | INTRAVENOUS | Status: AC
  Administered 2023-08-03: 1000 mg via INTRAVENOUS
  Filled 2023-08-03: qty 100

## 2023-08-03 MED ORDER — DEXMEDETOMIDINE HCL IN NACL 80 MCG/20ML IV SOLN
INTRAVENOUS | Status: AC
  Filled 2023-08-03: qty 20

## 2023-08-03 MED ORDER — HYDROMORPHONE HCL 1 MG/ML IJ SOLN: 0.2500 mg | INTRAMUSCULAR | Status: DC | PRN

## 2023-08-03 MED ORDER — HYDROCODONE-ACETAMINOPHEN 7.5-325 MG PO TABS: 1.0000 | ORAL_TABLET | Freq: Once | ORAL | Status: DC | PRN

## 2023-08-03 MED ORDER — POLYVINYL ALCOHOL 1.4 % OP SOLN
1.0000 [drp] | OPHTHALMIC | Status: DC | PRN
  Filled 2023-08-03 (×2): qty 15

## 2023-08-03 MED ORDER — ALBUTEROL SULFATE HFA 108 (90 BASE) MCG/ACT IN AERS: 2.0000 | INHALATION_SPRAY | Freq: Four times a day (QID) | RESPIRATORY_TRACT | Status: DC | PRN

## 2023-08-03 MED ORDER — FLEET ENEMA RE ENEM: 1.0000 | ENEMA | Freq: Once | RECTAL | Status: DC | PRN

## 2023-08-03 MED ORDER — 0.9 % SODIUM CHLORIDE (POUR BTL) OPTIME
TOPICAL | Status: DC | PRN
  Administered 2023-08-03: 1000 mL

## 2023-08-03 MED ORDER — SODIUM CHLORIDE 0.9 % IV SOLN: INTRAVENOUS | Status: DC

## 2023-08-03 MED ORDER — SODIUM CHLORIDE (PF) 0.9 % IJ SOLN
INTRAMUSCULAR | Status: AC
  Filled 2023-08-03: qty 10

## 2023-08-03 MED ORDER — POLYETHYLENE GLYCOL 3350 17 G PO PACK: 17.0000 g | PACK | Freq: Every day | ORAL | Status: DC | PRN

## 2023-08-03 MED ORDER — LIDOCAINE 2% (20 MG/ML) 5 ML SYRINGE
INTRAMUSCULAR | Status: DC | PRN
  Administered 2023-08-03: 20 mg via INTRAVENOUS

## 2023-08-03 MED ORDER — AMISULPRIDE (ANTIEMETIC) 5 MG/2ML IV SOLN: 10.0000 mg | Freq: Once | INTRAVENOUS | Status: DC | PRN

## 2023-08-03 MED ORDER — CEFAZOLIN SODIUM-DEXTROSE 2-4 GM/100ML-% IV SOLN
2.0000 g | Freq: Four times a day (QID) | INTRAVENOUS | Status: AC
  Administered 2023-08-03 (×2): 2 g via INTRAVENOUS
  Filled 2023-08-03 (×2): qty 100

## 2023-08-03 MED ORDER — TRIAMCINOLONE ACETONIDE 40 MG/ML IJ SUSP
INTRAMUSCULAR | Status: DC | PRN
  Administered 2023-08-03: 4 mL via INTRA_ARTICULAR

## 2023-08-03 MED ORDER — SODIUM CHLORIDE 0.9 % IR SOLN
Status: DC | PRN
  Administered 2023-08-03: 1000 mL

## 2023-08-03 MED ORDER — CEFAZOLIN SODIUM-DEXTROSE 2-4 GM/100ML-% IV SOLN
2.0000 g | INTRAVENOUS | Status: AC
  Administered 2023-08-03: 2 g via INTRAVENOUS
  Filled 2023-08-03: qty 100

## 2023-08-03 MED ORDER — BUPIVACAINE LIPOSOME 1.3 % IJ SUSP
INTRAMUSCULAR | Status: AC
  Filled 2023-08-03: qty 20

## 2023-08-03 MED ORDER — FLUTICASONE PROPIONATE 50 MCG/ACT NA SUSP: 1.0000 | Freq: Every day | NASAL | Status: DC | PRN

## 2023-08-03 MED ORDER — PROPOFOL 10 MG/ML IV BOLUS
INTRAVENOUS | Status: DC | PRN
Start: 2023-08-03 — End: 2023-08-03
  Administered 2023-08-03: 20 mg via INTRAVENOUS

## 2023-08-03 MED ORDER — ONDANSETRON HCL 4 MG/2ML IJ SOLN
4.0000 mg | Freq: Four times a day (QID) | INTRAMUSCULAR | Status: DC | PRN
  Administered 2023-08-03 – 2023-08-05 (×5): 4 mg via INTRAVENOUS
  Filled 2023-08-03 (×5): qty 2

## 2023-08-03 MED ORDER — ONDANSETRON HCL 4 MG/2ML IJ SOLN
INTRAMUSCULAR | Status: AC
  Filled 2023-08-03: qty 2

## 2023-08-03 MED ORDER — PHENYLEPHRINE HCL-NACL 20-0.9 MG/250ML-% IV SOLN
INTRAVENOUS | Status: DC | PRN
Start: 2023-08-03 — End: 2023-08-03
  Administered 2023-08-03: 25 ug/min via INTRAVENOUS

## 2023-08-03 MED ORDER — ONDANSETRON HCL 4 MG/2ML IJ SOLN: 4.0000 mg | Freq: Once | INTRAMUSCULAR | Status: DC | PRN

## 2023-08-03 MED ORDER — BUPIVACAINE HCL (PF) 0.5 % IJ SOLN
INTRAMUSCULAR | Status: AC
  Filled 2023-08-03: qty 30

## 2023-08-03 MED ORDER — MENTHOL 3 MG MT LOZG: 1.0000 | LOZENGE | OROMUCOSAL | Status: DC | PRN

## 2023-08-03 MED ORDER — DEXMEDETOMIDINE HCL IN NACL 80 MCG/20ML IV SOLN
INTRAVENOUS | Status: DC | PRN
Start: 2023-08-03 — End: 2023-08-03
  Administered 2023-08-03 (×2): 4 ug via INTRAVENOUS

## 2023-08-03 SURGICAL SUPPLY — 56 items
ADH SKN CLS APL DERMABOND .7 (GAUZE/BANDAGES/DRESSINGS) ×2
ATTUNE MED DOME PAT 32 KNEE (Knees) IMPLANT
ATTUNE PS FEM LT SZ 3 CEM KNEE (Femur) IMPLANT
ATTUNE PSRP INSR SZ3 8 KNEE (Insert) IMPLANT
BAG COUNTER SPONGE SURGICOUNT (BAG) IMPLANT
BAG SPEC THK2 15X12 ZIP CLS (MISCELLANEOUS) ×2
BAG SPNG CNTER NS LX DISP (BAG)
BAG ZIPLOCK 12X15 (MISCELLANEOUS) ×3 IMPLANT
BASE TIBIAL ROT PLAT SZ 3 KNEE (Knees) IMPLANT
BLADE SAG 18X100X1.27 (BLADE) ×3 IMPLANT
BLADE SAW SGTL 13X75X1.27 (BLADE) IMPLANT
BNDG CMPR 6 X 5 YARDS HK CLSR (GAUZE/BANDAGES/DRESSINGS) ×2
BNDG ELASTIC 6INX 5YD STR LF (GAUZE/BANDAGES/DRESSINGS) ×3 IMPLANT
BOWL SMART MIX CTS (DISPOSABLE) ×3 IMPLANT
BSPLAT TIB 3 CMNT ROT PLAT STR (Knees) ×2 IMPLANT
CEMENT HV SMART SET (Cement) ×6 IMPLANT
COVER SURGICAL LIGHT HANDLE (MISCELLANEOUS) ×3 IMPLANT
CUFF TOURN SGL QUICK 34 (TOURNIQUET CUFF) ×2
CUFF TRNQT CYL 34X4.125X (TOURNIQUET CUFF) ×3 IMPLANT
DERMABOND ADVANCED .7 DNX12 (GAUZE/BANDAGES/DRESSINGS) ×3 IMPLANT
DRAPE U-SHAPE 47X51 STRL (DRAPES) ×3 IMPLANT
DRESSING AQUACEL AG SP 3.5X10 (GAUZE/BANDAGES/DRESSINGS) IMPLANT
DRSG AQUACEL AG SP 3.5X10 (GAUZE/BANDAGES/DRESSINGS) ×2
DURAPREP 26ML APPLICATOR (WOUND CARE) ×3 IMPLANT
ELECT REM PT RETURN 15FT ADLT (MISCELLANEOUS) ×3 IMPLANT
GLOVE BIO SURGEON STRL SZ 6.5 (GLOVE) IMPLANT
GLOVE BIO SURGEON STRL SZ8 (GLOVE) ×3 IMPLANT
GLOVE BIOGEL PI IND STRL 6.5 (GLOVE) IMPLANT
GLOVE BIOGEL PI IND STRL 7.0 (GLOVE) IMPLANT
GLOVE BIOGEL PI IND STRL 8 (GLOVE) ×3 IMPLANT
GOWN STRL REUS W/ TWL LRG LVL3 (GOWN DISPOSABLE) ×3 IMPLANT
GOWN STRL REUS W/TWL LRG LVL3 (GOWN DISPOSABLE) ×2
HANDPIECE INTERPULSE COAX TIP (DISPOSABLE) ×2
HOLDER FOLEY CATH W/STRAP (MISCELLANEOUS) IMPLANT
IMMOBILIZER KNEE 20 (SOFTGOODS) ×2
IMMOBILIZER KNEE 20 THIGH 36 (SOFTGOODS) ×3 IMPLANT
KIT TURNOVER KIT A (KITS) IMPLANT
MANIFOLD NEPTUNE II (INSTRUMENTS) ×3 IMPLANT
NS IRRIG 1000ML POUR BTL (IV SOLUTION) ×3 IMPLANT
PACK TOTAL KNEE CUSTOM (KITS) ×3 IMPLANT
PADDING CAST ABS COTTON 6X4 NS (CAST SUPPLIES) IMPLANT
PADDING CAST COTTON 6X4 STRL (CAST SUPPLIES) ×6 IMPLANT
PIN STEINMAN FIXATION KNEE (PIN) IMPLANT
PROTECTOR NERVE ULNAR (MISCELLANEOUS) ×3 IMPLANT
SET HNDPC FAN SPRY TIP SCT (DISPOSABLE) ×3 IMPLANT
SPIKE FLUID TRANSFER (MISCELLANEOUS) ×3 IMPLANT
SUT MNCRL AB 4-0 PS2 18 (SUTURE) ×3 IMPLANT
SUT STRATAFIX 0 PDS 27 VIOLET (SUTURE) ×2
SUT VIC AB 2-0 CT1 27 (SUTURE) ×6
SUT VIC AB 2-0 CT1 TAPERPNT 27 (SUTURE) ×9 IMPLANT
SUTURE STRATFX 0 PDS 27 VIOLET (SUTURE) ×3 IMPLANT
TIBIAL BASE ROT PLAT SZ 3 KNEE (Knees) ×2 IMPLANT
TRAY FOLEY MTR SLVR 14FR STAT (SET/KITS/TRAYS/PACK) IMPLANT
TUBE SUCTION HIGH CAP CLEAR NV (SUCTIONS) ×3 IMPLANT
WATER STERILE IRR 1000ML POUR (IV SOLUTION) ×6 IMPLANT
WRAP KNEE MAXI GEL POST OP (GAUZE/BANDAGES/DRESSINGS) ×3 IMPLANT

## 2023-08-03 NOTE — Transfer of Care (Signed)
Immediate Anesthesia Transfer of Care Note  Patient: Melanie Roth  Procedure(s) Performed: LEFT TOTAL KNEE ARTHROPLASTY (Left: Knee) RIGHT KNEE CORTISONE INJECTION (Right: Knee)  Patient Location: PACU  Anesthesia Type:MAC and Spinal  Level of Consciousness: awake, alert , oriented, and patient cooperative  Airway & Oxygen Therapy: Patient Spontanous Breathing and Patient connected to face mask oxygen  Post-op Assessment: Report given to RN, Post -op Vital signs reviewed and stable, and Patient moving all extremities  Post vital signs: Reviewed and stable  Last Vitals:  Vitals Value Taken Time  BP 114/66 08/03/23 1225  Temp    Pulse 100 08/03/23 1227  Resp 12 08/03/23 1227  SpO2 98 % 08/03/23 1227  Vitals shown include unfiled device data.  Last Pain:  Vitals:   08/03/23 1020  TempSrc:   PainSc: Asleep         Complications: No notable events documented.

## 2023-08-03 NOTE — Progress Notes (Signed)
Orthopedic Tech Progress Note Patient Details:  Melanie Roth [birth year] [id number]  CPM Left Knee CPM Left Knee: On Left Knee Flexion (Degrees): 40 Left Knee Extension (Degrees): 10  Post Interventions Patient Tolerated: Well Instructions Provided: Care of device  [place] A Gerilyn Pilgrim 08/03/2023, 12:48 PM

## 2023-08-03 NOTE — Anesthesia Postprocedure Evaluation (Signed)
Anesthesia Post Note  Patient: Melanie Roth  Procedure(s) Performed: LEFT TOTAL KNEE ARTHROPLASTY (Left: Knee) RIGHT KNEE CORTISONE INJECTION (Right: Knee)     Patient location during evaluation: PACU Anesthesia Type: Regional, MAC and Spinal Level of consciousness: awake and alert and oriented Pain management: pain level controlled Vital Signs Assessment: post-procedure vital signs reviewed and stable Respiratory status: spontaneous breathing, nonlabored ventilation and respiratory function stable Cardiovascular status: blood pressure returned to baseline and stable Postop Assessment: no headache, no backache, spinal receding and no apparent nausea or vomiting Anesthetic complications: no   No notable events documented.  Last Vitals:  Vitals:   08/03/23 1415 08/03/23 1436  BP: 124/72 126/72  Pulse: 99 98  Resp: 13 18  Temp: (!) 36.3 C (!) 36.4 C  SpO2: 96% 100%    Last Pain:  Vitals:   08/03/23 1400  TempSrc:   PainSc: 0-No pain                 Lannie Fields

## 2023-08-03 NOTE — Plan of Care (Signed)
Problem: Education: Goal: Knowledge of the prescribed therapeutic regimen will improve Outcome: Progressing   Problem: Activity: Goal: Range of joint motion will improve Outcome: Progressing   Problem: Pain Management: Goal: Pain level will decrease with appropriate interventions Outcome: Progressing   Problem: Safety: Goal: Ability to remain free from injury will improve Outcome: Progressing

## 2023-08-03 NOTE — Discharge Instructions (Addendum)
Ollen Gross, MD Total Joint Specialist [facility] [place] [address], [address] [place], [place] [address] [phone]  TOTAL KNEE REPLACEMENT POSTOPERATIVE DIRECTIONS    Knee Rehabilitation, Guidelines Following Surgery  Results after knee surgery are often greatly improved when you follow the exercise, range of motion and muscle strengthening exercises prescribed by your doctor. Safety measures are also important to protect the knee from further injury. If any of these exercises cause you to have increased pain or swelling in your knee joint, decrease the amount until you are comfortable again and slowly increase them. If you have problems or questions, call your caregiver or physical therapist for advice.   BLOOD CLOT PREVENTION Take a 10 mg Xarelto once a day for three weeks following surgery. Then take an 81 mg Aspirin once a day for three weeks. Then discontinue Aspirin. You may resume your vitamins/supplements once you have discontinued the Xarelto. Do not take any NSAIDs (Advil, Aleve, Ibuprofen, Meloxicam, etc.) until you have discontinued the Xarelto.   HOME CARE INSTRUCTIONS  Remove items at home which could result in a fall. This includes throw rugs or furniture in walking pathways.  ICE to the affected knee as much as tolerated. Icing helps control swelling. If the swelling is well controlled you will be more comfortable and rehab easier. Continue to use ice on the knee for pain and swelling from surgery. You may notice swelling that will progress down to the foot and ankle. This is normal after surgery. Elevate the leg when you are not up walking on it.    Continue to use the breathing machine which will help keep your temperature down. It is common for your temperature to cycle up and down following surgery, especially at night when you are not up moving around and exerting yourself. The breathing machine keeps your lungs expanded and your temperature  down. Do not place pillow under the operative knee, focus on keeping the knee straight while resting  DIET You may resume your previous home diet once you are discharged from the hospital.  DRESSING / WOUND CARE / SHOWERING Keep your bulky bandage on for 2 days. On the third post-operative day you may remove the Ace bandage and gauze. There is a waterproof adhesive bandage on your skin which will stay in place until your first follow-up appointment. Once you remove this you will not need to place another bandage You may begin showering 3 days following surgery, but do not submerge the incision under water.  ACTIVITY For the first 5 days, the key is rest and control of pain and swelling Do your home exercises twice a day starting on post-operative day 3. On the days you go to physical therapy, just do the home exercises once that day. You should rest, ice and elevate the leg for 50 minutes out of every hour. Get up and walk/stretch for 10 minutes per hour. After 5 days you can increase your activity slowly as tolerated. Walk with your walker as instructed. Use the walker until you are comfortable transitioning to a cane. Walk with the cane in the opposite hand of the operative leg. You may discontinue the cane once you are comfortable and walking steadily. Avoid periods of inactivity such as sitting longer than an hour when not asleep. This helps prevent blood clots.  You may discontinue the knee immobilizer once you are able to perform a straight leg raise while lying down. You may resume a sexual relationship in one month or  when given the OK by your doctor.  You may return to work once you are cleared by your doctor.  Do not drive a car for 6 weeks or until released by your surgeon.  Do not drive while taking narcotics.  TED HOSE STOCKINGS Wear the elastic stockings on both legs for three weeks following surgery during the day. You may remove them at night for sleeping.  WEIGHT  BEARING Weight bearing as tolerated with assist device (walker, cane, etc) as directed, use it as long as suggested by your surgeon or therapist, typically at least 4-6 weeks.  POSTOPERATIVE CONSTIPATION PROTOCOL Constipation - defined medically as fewer than three stools per week and severe constipation as less than one stool per week.  One of the most common issues patients have following surgery is constipation.  Even if you have a regular bowel pattern at home, your normal regimen is likely to be disrupted due to multiple reasons following surgery.  Combination of anesthesia, postoperative narcotics, change in appetite and fluid intake all can affect your bowels.  In order to avoid complications following surgery, here are some recommendations in order to help you during your recovery period.  Colace (docusate) - Pick up an over-the-counter form of Colace or another stool softener and take twice a day as long as you are requiring postoperative pain medications.  Take with a full glass of water daily.  If you experience loose stools or diarrhea, hold the colace until you stool forms back up. If your symptoms do not get better within 1 week or if they get worse, check with your doctor. Dulcolax (bisacodyl) - Pick up over-the-counter and take as directed by the product packaging as needed to assist with the movement of your bowels.  Take with a full glass of water.  Use this product as needed if not relieved by Colace only.  MiraLax (polyethylene glycol) - Pick up over-the-counter to have on hand. MiraLax is a solution that will increase the amount of water in your bowels to assist with bowel movements.  Take as directed and can mix with a glass of water, juice, soda, coffee, or tea. Take if you go more than two days without a movement. Do not use MiraLax more than once per day. Call your doctor if you are still constipated or irregular after using this medication for 7 days in a row.  If you continue  to have problems with postoperative constipation, please contact the office for further assistance and recommendations.  If you experience "the worst abdominal pain ever" or develop nausea or vomiting, please contact the office immediatly for further recommendations for treatment.  ITCHING If you experience itching with your medications, try taking only a single pain pill, or even half a pain pill at a time.  You can also use Benadryl over the counter for itching or also to help with sleep.   MEDICATIONS See your medication summary on the "After Visit Summary" that the nursing staff will review with you prior to discharge.  You may have some home medications which will be placed on hold until you complete the course of blood thinner medication.  It is important for you to complete the blood thinner medication as prescribed by your surgeon.  Continue your approved medications as instructed at time of discharge.  PRECAUTIONS If you experience chest pain or shortness of breath - call 911 immediately for transfer to the hospital emergency department.  If you develop a fever greater that 101 F, purulent  drainage from wound, increased redness or drainage from wound, foul odor from the wound/dressing, or calf pain - CONTACT YOUR SURGEON.                                                   FOLLOW-UP APPOINTMENTS Make sure you keep all of your appointments after your operation with your surgeon and caregivers. You should call the office at the above phone number and make an appointment for approximately two weeks after the date of your surgery or on the date instructed by your surgeon outlined in the "After Visit Summary".  RANGE OF MOTION AND STRENGTHENING EXERCISES  Rehabilitation of the knee is important following a knee injury or an operation. After just a few days of immobilization, the muscles of the thigh which control the knee become weakened and shrink (atrophy). Knee exercises are designed to build up  the tone and strength of the thigh muscles and to improve knee motion. Often times heat used for twenty to thirty minutes before working out will loosen up your tissues and help with improving the range of motion but do not use heat for the first two weeks following surgery. These exercises can be done on a training (exercise) mat, on the floor, on a table or on a bed. Use what ever works the best and is most comfortable for you Knee exercises include:  Leg Lifts - While your knee is still immobilized in a splint or cast, you can do straight leg raises. Lift the leg to 60 degrees, hold for 3 sec, and slowly lower the leg. Repeat 10-20 times 2-3 times daily. Perform this exercise against resistance later as your knee gets better.  Quad and Hamstring Sets - Tighten up the muscle on the front of the thigh (Quad) and hold for 5-10 sec. Repeat this 10-20 times hourly. Hamstring sets are done by pushing the foot backward against an object and holding for 5-10 sec. Repeat as with quad sets.  Leg Slides: Lying on your back, slowly slide your foot toward your buttocks, bending your knee up off the floor (only go as far as is comfortable). Then slowly slide your foot back down until your leg is flat on the floor again. Angel Wings: Lying on your back spread your legs to the side as far apart as you can without causing discomfort.  A rehabilitation program following serious knee injuries can speed recovery and prevent re-injury in the future due to weakened muscles. Contact your doctor or a physical therapist for more information on knee rehabilitation.   POST-OPERATIVE OPIOID TAPER INSTRUCTIONS: It is important to wean off of your opioid medication as soon as possible. If you do not need pain medication after your surgery it is ok to stop day one. Opioids include: Codeine, Hydrocodone(Norco, Vicodin), Oxycodone(Percocet, oxycontin) and hydromorphone amongst others.  Long term and even short term use of opiods can  cause: Increased pain response Dependence Constipation Depression Respiratory depression And more.  Withdrawal symptoms can include Flu like symptoms Nausea, vomiting And more Techniques to manage these symptoms Hydrate well Eat regular healthy meals Stay active Use relaxation techniques(deep breathing, meditating, yoga) Do Not substitute Alcohol to help with tapering If you have been on opioids for less than two weeks and do not have pain than it is ok to stop all together.  Plan to  wean off of opioids This plan should start within one week post op of your joint replacement. Maintain the same interval or time between taking each dose and first decrease the dose.  Cut the total daily intake of opioids by one tablet each day Next start to increase the time between doses. The last dose that should be eliminated is the evening dose.   IF YOU ARE TRANSFERRED TO A SKILLED REHAB FACILITY If the patient is transferred to a skilled rehab facility following release from the hospital, a list of the current medications will be sent to the facility for the patient to continue.  When discharged from the skilled rehab facility, please have the facility set up the patient's Home Health Physical Therapy prior to being released. Also, the skilled facility will be responsible for providing the patient with their medications at time of release from the facility to include their pain medication, the muscle relaxants, and their blood thinner medication. If the patient is still at the rehab facility at time of the two week follow up appointment, the skilled rehab facility will also need to assist the patient in arranging follow up appointment in our office and any transportation needs.  MAKE SURE YOU:  Understand these instructions.  Get help right away if you are not doing well or get worse.   DENTAL ANTIBIOTICS:  In most cases prophylactic antibiotics for Dental procdeures after total joint surgery are  not necessary.  Exceptions are as follows:  1. History of prior total joint infection  2. Severely immunocompromised (Organ Transplant, cancer chemotherapy, Rheumatoid biologic meds such as Humera)  3. Poorly controlled diabetes (A1C &gt; 8.0, blood glucose over 200)  If you have one of these conditions, contact your surgeon for an antibiotic prescription, prior to your dental procedure.    Pick up stool softner and laxative for home use following surgery while on pain medications. Do not submerge incision under water. Please use good hand washing techniques while changing dressing each day. May shower starting three days after surgery. Please use a clean towel to pat the incision dry following showers. Continue to use ice for pain and swelling after surgery. Do not use any lotions or creams on the incision until instructed by your surgeon.  Information on my medicine - XARELTO (Rivaroxaban)  Why was Xarelto prescribed for you? Xarelto was prescribed for you to reduce the risk of blood clots forming after orthopedic surgery. The medical term for these abnormal blood clots is venous thromboembolism (VTE).  What do you need to know about xarelto ? Take your Xarelto ONCE DAILY at the same time every day. You may take it either with or without food.  If you have difficulty swallowing the tablet whole, you may crush it and mix in applesauce just prior to taking your dose.  Take Xarelto exactly as prescribed by your doctor and DO NOT stop taking Xarelto without talking to the doctor who prescribed the medication.  Stopping without other VTE prevention medication to take the place of Xarelto may increase your risk of developing a clot.  After discharge, you should have regular check-up appointments with your healthcare provider that is prescribing your Xarelto.    What do you do if you miss a dose? If you miss a dose, take it as soon as you remember on the same day then  continue your regularly scheduled once daily regimen the next day. Do not take two doses of Xarelto on the same day.   Important  Safety Information A possible side effect of Xarelto is bleeding. You should call your healthcare provider right away if you experience any of the following: Bleeding from an injury or your nose that does not stop. Unusual colored urine (red or dark brown) or unusual colored stools (red or black). Unusual bruising for unknown reasons. A serious fall or if you hit your head (even if there is no bleeding).  Some medicines may interact with Xarelto and might increase your risk of bleeding while on Xarelto. To help avoid this, consult your healthcare provider or pharmacist prior to using any new prescription or non-prescription medications, including herbals, vitamins, non-steroidal anti-inflammatory drugs (NSAIDs) and supplements.  This website has more information on Xarelto: VisitDestination.com.br.

## 2023-08-03 NOTE — Evaluation (Signed)
Physical Therapy Evaluation Patient Details Name: Melanie Roth MRN: [id number] DOB: 10/25/1956 Today's Date: 08/03/2023  History of Present Illness  Pt is 67 yo female admitted on 08/03/23 for L TKA.  Pt with hx including but not limited to breast CA, OA, GERD  Clinical Impression  Pt is s/p TKA resulting in the deficits listed below (see PT Problem List). At baseline, pt is independent.  She has home support at discharge. Today, pt was able to transition to EOB with CGA but then reports bottom still feels numb from spinal.  She was able to SLR and wanted to attempt to get to chair.  Pt was able to take small steps to the chair with min A of 2 for safety doing well compensating with upper extremities.  Pt's pain increasing and requested further pain meds - RN aware and is following up.  Pt expected to progress well with therapy.  Pt will benefit from acute skilled PT to increase their independence and safety with mobility to allow discharge.          If plan is discharge home, recommend the following: A little help with walking and/or transfers;A little help with bathing/dressing/bathroom;Assistance with cooking/housework;Help with stairs or ramp for entrance   Can travel by private vehicle        Equipment Recommendations Other (comment) (youth RW)  Recommendations for Other Services       Functional Status Assessment Patient has had a recent decline in their functional status and demonstrates the ability to make significant improvements in function in a reasonable and predictable amount of time.     Precautions / Restrictions Precautions Precautions: Fall Restrictions Weight Bearing Restrictions: Yes LLE Weight Bearing: Weight bearing as tolerated      Mobility  Bed Mobility Overal bed mobility: Needs Assistance Bed Mobility: Supine to Sit     Supine to sit: Min assist          Transfers Overall transfer level: Needs assistance Equipment used: Rolling  walker (2 wheels) Transfers: Sit to/from Stand Sit to Stand: Min assist, +2 safety/equipment           General transfer comment: Cues for hand placement and L LE management    Ambulation/Gait Ambulation/Gait assistance: Min assist, +2 safety/equipment Gait Distance (Feet): 2 Feet Assistive device: Rolling walker (2 wheels) Gait Pattern/deviations: Step-to pattern, Decreased stride length, Shuffle, Decreased weight shift to left Gait velocity: decreased     General Gait Details: Small steps to chair with min A for safety; tolerated without buckling  Stairs            Wheelchair Mobility     Tilt Bed    Modified Rankin (Stroke Patients Only)       Balance Overall balance assessment: Needs assistance Sitting-balance support: No upper extremity supported Sitting balance-Leahy Scale: Good     Standing balance support: Bilateral upper extremity supported, Reliant on assistive device for balance Standing balance-Leahy Scale: Poor Standing balance comment: steady with RW                             Pertinent Vitals/Pain Pain Assessment Pain Assessment: Faces Faces Pain Scale: Hurts whole lot Pain Location: L knee Pain Descriptors / Indicators: Discomfort, Aching Pain Intervention(s): Limited activity within patient's tolerance, Monitored during session, Repositioned, Ice applied, Premedicated before session, Patient requesting pain meds-RN notified    Home Living Family/patient expects to be discharged to:: Private residence Living Arrangements: Spouse/significant other  Available Help at Discharge: Family;Available 24 hours/day Type of Home: House Home Access: Stairs to enter Entrance Stairs-Rails: None Entrance Stairs-Number of Steps: 2   Home Layout: One level Home Equipment: Rollator (4 wheels)      Prior Function Prior Level of Function : Independent/Modified Independent;Working/employed;Driving             Mobility Comments:  Recently having to use rollator due to knee pain but previously could ambulate in community wihtout AD ADLs Comments: independent adls, iadls,     Extremity/Trunk Assessment   Upper Extremity Assessment Upper Extremity Assessment: Overall WFL for tasks assessed    Lower Extremity Assessment Lower Extremity Assessment: LLE deficits/detail;RLE deficits/detail RLE Deficits / Details: ROM WFL; MMT 5/5 LLE Deficits / Details: Expected post op changes; ROM: knee ~5 to 60 degrees; MMT: ankle 5/5, knee 2/5, hip 2/5    Cervical / Trunk Assessment Cervical / Trunk Assessment: Normal Cervical / Trunk Exceptions: Upon sitting pt reports buttock still feels numb  Communication      Cognition Arousal: Alert Behavior During Therapy: WFL for tasks assessed/performed Overall Cognitive Status: Within Functional Limits for tasks assessed                                          General Comments      Exercises Total Joint Exercises Ankle Circles/Pumps: AROM, 10 reps, Both, Supine   Assessment/Plan    PT Assessment Patient needs continued PT services  PT Problem List Decreased strength;Pain;Decreased range of motion;Decreased activity tolerance;Decreased balance;Decreased mobility;Decreased knowledge of precautions;Decreased knowledge of use of DME       PT Treatment Interventions DME instruction;Therapeutic exercise;Gait training;Balance training;Stair training;Functional mobility training;Therapeutic activities;Patient/family education;Modalities    PT Goals (Current goals can be found in the Care Plan section)  Acute Rehab PT Goals Patient Stated Goal: return home PT Goal Formulation: With patient Time For Goal Achievement: 08/17/23 Potential to Achieve Goals: Good    Frequency 7X/week     Co-evaluation               AM-PAC PT "6 Clicks" Mobility  Outcome Measure Help needed turning from your back to your side while in a flat bed without using bedrails?:  A Little Help needed moving from lying on your back to sitting on the side of a flat bed without using bedrails?: A Little Help needed moving to and from a bed to a chair (including a wheelchair)?: A Little Help needed standing up from a chair using your arms (e.g., wheelchair or bedside chair)?: A Little Help needed to walk in hospital room?: Total Help needed climbing 3-5 steps with a railing? : Total 6 Click Score: 14    End of Session Equipment Utilized During Treatment: Gait belt Activity Tolerance: Other (comment) (LImited by pain and effects of spinal) Patient left: with chair alarm set;in chair;with call bell/phone within reach Nurse Communication: Mobility status;Patient requests pain meds;Other (comment) (pt reports takes magnesium at night for constipation) PT Visit Diagnosis: Other abnormalities of gait and mobility (R26.89);Pain;Muscle weakness (generalized) (M62.81) Pain - Right/Left: Left Pain - part of body: Knee    Time: 1308-6578 PT Time Calculation (min) (ACUTE ONLY): 22 min   Charges:   PT Evaluation $PT Eval Low Complexity: 1 Low   PT General Charges $$ ACUTE PT VISIT: 1 Visit         Aleynah Rocchio, PT Acute Rehab Services  [facility] [phone]   Rayetta Humphrey 08/03/2023, 4:56 PM

## 2023-08-03 NOTE — Anesthesia Procedure Notes (Signed)
Anesthesia Regional Block: Adductor canal block   Pre-Anesthetic Checklist: , timeout performed,  Correct Patient, Correct Site, Correct Laterality,  Correct Procedure, Correct Position, site marked,  Risks and benefits discussed,  Surgical consent,  Pre-op evaluation,  At surgeon's request and post-op pain management  Laterality: Left  Prep: Maximum Sterile Barrier Precautions used, chloraprep       Needles:  Injection technique: Single-shot  Needle Type: Echogenic Stimulator Needle     Needle Length: 9cm  Needle Gauge: 22     Additional Needles:   Procedures:,,,, ultrasound used (permanent image in chart),,    Narrative:  Start time: 08/03/2023 9:55 AM End time: 08/03/2023 10:00 AM Injection made incrementally with aspirations every 5 mL.  Performed by: Personally  Anesthesiologist: Lannie Fields, DO  Additional Notes: Monitors applied. No increased pain on injection. No increased resistance to injection. Injection made in 5cc increments. Good needle visualization. Patient tolerated procedure well.

## 2023-08-03 NOTE — Plan of Care (Signed)
  Problem: Education: Goal: Knowledge of the prescribed therapeutic regimen will improve Outcome: Progressing   Problem: Activity: Goal: Ability to avoid complications of mobility impairment will improve Outcome: Progressing   Problem: Pain Management: Goal: Pain level will decrease with appropriate interventions Outcome: Progressing   Problem: Education: Goal: Knowledge of General Education information will improve Description: Including pain rating scale, medication(s)/side effects and non-pharmacologic comfort measures Outcome: Progressing   Problem: Activity: Goal: Risk for activity intolerance will decrease Outcome: Progressing   Problem: Nutrition: Goal: Adequate nutrition will be maintained Outcome: Progressing   Problem: Elimination: Goal: Will not experience complications related to bowel motility Outcome: Progressing   Problem: Pain Managment: Goal: General experience of comfort will improve Outcome: Progressing   Problem: Safety: Goal: Ability to remain free from injury will improve Outcome: Progressing   Problem: Education: Goal: Knowledge of the prescribed therapeutic regimen will improve Outcome: Progressing   Problem: Activity: Goal: Ability to avoid complications of mobility impairment will improve Outcome: Progressing   Problem: Pain Management: Goal: Pain level will decrease with appropriate interventions Outcome: Progressing   Problem: Education: Goal: Knowledge of General Education information will improve Description: Including pain rating scale, medication(s)/side effects and non-pharmacologic comfort measures Outcome: Progressing   Problem: Activity: Goal: Risk for activity intolerance will decrease Outcome: Progressing   Problem: Nutrition: Goal: Adequate nutrition will be maintained Outcome: Progressing   Problem: Elimination: Goal: Will not experience complications related to bowel motility Outcome: Progressing   Problem:  Pain Managment: Goal: General experience of comfort will improve Outcome: Progressing   Problem: Safety: Goal: Ability to remain free from injury will improve Outcome: Progressing

## 2023-08-03 NOTE — Progress Notes (Signed)
Physical Therapy Treatment Patient Details Name: Melanie Roth MRN: [id number] DOB: 1956-03-14 Today's Date: 08/03/2023   History of Present Illness Pt is 67 yo female admitted on 08/03/23 for L TKA.  Pt with hx including but not limited to breast CA, OA, GERD    PT Comments  Pt in chair and not feeling well.  PT was able to assist pt back to bed with min A of 2 for safety due to still with some numbness in bottom.  Pt educated on transfer techniques and positioning for rest/sleep.  Will continue plan of care.     If plan is discharge home, recommend the following: A little help with walking and/or transfers;A little help with bathing/dressing/bathroom;Assistance with cooking/housework;Help with stairs or ramp for entrance   Can travel by private vehicle        Equipment Recommendations  Other (comment) (youth RW)    Recommendations for Other Services       Precautions / Restrictions Precautions Precautions: Fall Restrictions Weight Bearing Restrictions: Yes LLE Weight Bearing: Weight bearing as tolerated     Mobility  Bed Mobility Overal bed mobility: Needs Assistance Bed Mobility: Sit to Supine     Supine to sit: Min assist Sit to supine: Min assist   General bed mobility comments: Min A for bil LE    Transfers Overall transfer level: Needs assistance Equipment used: Rolling walker (2 wheels) Transfers: Sit to/from Stand Sit to Stand: Min assist, +2 safety/equipment           General transfer comment: Cues for hand placement and L LE management    Ambulation/Gait Ambulation/Gait assistance: Min assist, +2 safety/equipment Gait Distance (Feet): 2 Feet Assistive device: Rolling walker (2 wheels) Gait Pattern/deviations: Step-to pattern, Decreased stride length, Shuffle, Decreased weight shift to left Gait velocity: decreased     General Gait Details: Small steps to bed with min A for safety; tolerated without buckling   Stairs              Wheelchair Mobility     Tilt Bed    Modified Rankin (Stroke Patients Only)       Balance Overall balance assessment: Needs assistance Sitting-balance support: No upper extremity supported Sitting balance-Leahy Scale: Good     Standing balance support: Bilateral upper extremity supported, Reliant on assistive device for balance Standing balance-Leahy Scale: Poor Standing balance comment: steady with RW                            Cognition Arousal: Alert Behavior During Therapy: WFL for tasks assessed/performed Overall Cognitive Status: Within Functional Limits for tasks assessed                                          Exercises Total Joint Exercises Ankle Circles/Pumps: AROM, 10 reps, Both, Supine    General Comments General comments (skin integrity, edema, etc.): Pt was in chair and not feeling well (nauseated/vomiting) needing to get back to bed.  PT was available and assisted.  Pt educated on transfer techniques.  Pt asked about sleeping on her side - discussed it is allowed, do recommend pillow between knees to support and trying to keep L leg as straight as possible. Provided with extra pillows/pillowcases for positioning.      Pertinent Vitals/Pain Pain Assessment Pain Assessment: Faces Faces Pain Scale: Hurts even more Pain  Location: L knee Pain Descriptors / Indicators: Discomfort, Aching Pain Intervention(s): Limited activity within patient's tolerance, Monitored during session, Premedicated before session, Repositioned    Home Living Family/patient expects to be discharged to:: Private residence Living Arrangements: Spouse/significant other  Type of Home: House Home Access: Stairs to enter Entrance Stairs-Rails: None Entrance Stairs-Number of Steps: 2          Prior Function            PT Goals (current goals can now be found in the care plan section) Acute Rehab PT Goals Patient Stated Goal: return home PT  Goal Formulation: With patient Time For Goal Achievement: 08/17/23 Potential to Achieve Goals: Good Progress towards PT goals: Progressing toward goals    Frequency    7X/week      PT Plan      Co-evaluation              AM-PAC PT "6 Clicks" Mobility   Outcome Measure  Help needed turning from your back to your side while in a flat bed without using bedrails?: A Little Help needed moving from lying on your back to sitting on the side of a flat bed without using bedrails?: A Little Help needed moving to and from a bed to a chair (including a wheelchair)?: A Little Help needed standing up from a chair using your arms (e.g., wheelchair or bedside chair)?: A Little Help needed to walk in hospital room?: Total Help needed climbing 3-5 steps with a railing? : Total 6 Click Score: 14    End of Session Equipment Utilized During Treatment: Gait belt Activity Tolerance: Other (comment) (limited by pain and nausea) Patient left: with call bell/phone within reach;in bed;with bed alarm set;with SCD's reapplied Nurse Communication: Mobility status PT Visit Diagnosis: Other abnormalities of gait and mobility (R26.89);Pain;Muscle weakness (generalized) (M62.81) Pain - Right/Left: Left Pain - part of body: Knee     Time: 6045-4098 PT Time Calculation (min) (ACUTE ONLY): 11 min  Charges:    $Therapeutic Activity: 8-22 mins PT General Charges $$ ACUTE PT VISIT: 1 Visit                     Anise Salvo, PT Acute Rehab [facility] [phone]    Rayetta Humphrey 08/03/2023, 5:50 PM

## 2023-08-03 NOTE — Op Note (Signed)
OPERATIVE REPORT-TOTAL KNEE ARTHROPLASTY   Pre-operative diagnosis- Osteoarthritis  Bilateral knee(s)  Post-operative diagnosis- Osteoarthritis Bilateral knee(s)  Procedure-  Left  Total Knee Arthroplasty   Right knee cortisone injection  Surgeon- Gus Rankin. Shabre Kreher, MD  Assistant- Arcola Jansky, PA-C   Anesthesia-   Adductor canal block and spinal  EBL-50 mL   Drains None  Tourniquet time- 50 minutes @ 300 mm Hg  Complications- None  Condition-PACU - hemodynamically stable.   Brief Clinical Note  Melanie Roth is a 67 y.o. year old female with end stage OA of her left knee with progressively worsening pain and dysfunction. She has constant pain, with activity and at rest and significant functional deficits with difficulties even with ADLs. She has had extensive non-op management including analgesics, injections of cortisone and viscosupplements, and home exercise program, but remains in significant pain with significant dysfunction. Radiographs show bone on bone arthritis medial and patellofemoral. She presents now for left Total Knee Arthroplasty.  She also has advanced osteoarthritis of her right knee and requests an injection   Procedure in detail---   The patient is brought into the operating room and positioned supine on the operating table. After successful administration of  Adductor canal block and spinal,   a tourniquet is placed high on the  Left thigh(s) and the lower extremity is prepped and draped in the usual sterile fashion. Time out is performed by the operating team and then the  Left lower extremity is wrapped in Esmarch, knee flexed and the tourniquet inflated to 300 mmHg.       A midline incision is made with a ten blade through the subcutaneous tissue to the level of the extensor mechanism. A fresh blade is used to make a medial parapatellar arthrotomy. Soft tissue over the proximal medial tibia is subperiosteally elevated to the joint line with a  knife and into the semimembranosus bursa with a Cobb elevator. Soft tissue over the proximal lateral tibia is elevated with attention being paid to avoiding the patellar tendon on the tibial tubercle. The patella is everted, knee flexed 90 degrees and the ACL and PCL are removed. Findings are bone on bone all 3 compartments with massive global osteophytes        The drill is used to create a starting hole in the distal femur and the canal is thoroughly irrigated with sterile saline to remove the fatty contents. The 5 degree Left  valgus alignment guide is placed into the femoral canal and the distal femoral cutting block is pinned to remove 9 mm off the distal femur. Resection is made with an oscillating saw.      The tibia is subluxed forward and the menisci are removed. The extramedullary alignment guide is placed referencing proximally at the medial aspect of the tibial tubercle and distally along the second metatarsal axis and tibial crest. The block is pinned to remove 2mm off the more deficient medial  side. Resection is made with an oscillating saw. Size 3is the most appropriate size for the tibia and the proximal tibia is prepared with the modular drill and keel punch for that size.      The femoral sizing guide is placed and size 3 is most appropriate. Rotation is marked off the epicondylar axis and confirmed by creating a rectangular flexion gap at 90 degrees. The size 3 cutting block is pinned in this rotation and the anterior, posterior and chamfer cuts are made with the oscillating saw. The intercondylar block is then placed  and that cut is made.      Trial size 3 tibial component, trial size 3 posterior stabilized femur and a 8  mm posterior stabilized rotating platform insert trial is placed. Full extension is achieved with excellent varus/valgus and anterior/posterior balance throughout full range of motion. The patella is everted and thickness measured to be 22  mm. Free hand resection is taken  to 12 mm, a 32 template is placed, lug holes are drilled, trial patella is placed, and it tracks normally. Osteophytes are removed off the posterior femur with the trial in place. All trials are removed and the cut bone surfaces prepared with pulsatile lavage. Cement is mixed and once ready for implantation, the size 3 tibial implant, size  3 posterior stabilized femoral component, and the size 32 patella are cemented in place and the patella is held with the clamp. The trial insert is placed and the knee held in full extension. The Exparel (20 ml mixed with 60 ml saline) is injected into the extensor mechanism, posterior capsule, medial and lateral gutters and subcutaneous tissues.  All extruded cement is removed and once the cement is hard the permanent 8 mm posterior stabilized rotating platform insert is placed into the tibial tray.      The wound is copiously irrigated with saline solution and the extensor mechanism closed with # 0 Stratofix suture. The tourniquet is released for a total tourniquet time of 50  minutes. Flexion against gravity is 130 degrees and the patella tracks normally. Subcutaneous tissue is closed with 2.0 vicryl and subcuticular with running 4.0 Monocryl. The incision is cleaned and dried and steri-strips and a bulky sterile dressing are applied. The limb is placed into a knee immobilizer.  I then injected the right knee with 3 ml 0.25% Marcaine and 1 ml Kenalog after a sterile prep with alcohol. The patient is awakened and transported to recovery in stable condition.      Please note that a surgical assistant was a medical necessity for this procedure in order to perform it in a safe and expeditious manner. Surgical assistant was necessary to retract the ligaments and vital neurovascular structures to prevent injury to them and also necessary for proper positioning of the limb to allow for anatomic placement of the prosthesis.   Gus Rankin Taytem Ghattas, MD    08/03/2023, 11:54 AM

## 2023-08-03 NOTE — Anesthesia Procedure Notes (Signed)
Procedure Name: MAC Date/Time: 08/03/2023 10:26 AM  Performed by: Elisabeth Cara, CRNAPre-anesthesia Checklist: Patient identified, Emergency Drugs available, Suction available, Patient being monitored and Timeout performed Oxygen Delivery Method: Simple face mask Placement Confirmation: positive ETCO2 Dental Injury: Teeth and Oropharynx as per pre-operative assessment

## 2023-08-03 NOTE — Anesthesia Procedure Notes (Signed)
Spinal  Start time: 08/03/2023 10:29 AM End time: 08/03/2023 10:33 AM Reason for block: surgical anesthesia Staffing Performed: resident/CRNA  Resident/CRNA: Elisabeth Cara, CRNA Performed by: Elisabeth Cara, CRNA Authorized by: Lannie Fields, DO   Preanesthetic Checklist Completed: patient identified, IV checked, site marked, risks and benefits discussed, surgical consent, monitors and equipment checked, pre-op evaluation and timeout performed Spinal Block Patient position: sitting Prep: DuraPrep and site prepped and draped Patient monitoring: heart rate, cardiac monitor, continuous pulse ox and blood pressure Approach: midline Location: L3-4 Injection technique: single-shot Needle Needle type: Pencan  Needle gauge: 24 G Needle length: 10 cm Assessment Sensory level: T4 Additional Notes Spinal kit expiration date checked and verified. Pt placed in sitting position for spinal placement. Sterile prep and drape of back. Site anesthetized with local. One attempt bu CRNA. Clear free flowing CSF obtained. - heme. Pt tolerated procedure well. Placed supine after spinal placement.

## 2023-08-03 NOTE — Progress Notes (Signed)
Orthopedic Tech Progress Note Patient Details:  Melanie Roth 09-06-1956 [id number]  CPM Left Knee CPM Left Knee: Off Left Knee Flexion (Degrees): 40 Left Knee Extension (Degrees): 10  Post Interventions Patient Tolerated: Well Instructions Provided: Care of device Patient removed from CPM by PT before session. Darleen Crocker 08/03/2023, 4:48 PM

## 2023-08-03 NOTE — Interval H&P Note (Signed)
History and Physical Interval Note:  08/03/2023 8:27 AM  Melanie Roth  has presented today for surgery, with the diagnosis of left knee osteoarthritis.  The various methods of treatment have been discussed with the patient and family. After consideration of risks, benefits and other options for treatment, the patient has consented to  Procedure(s): TOTAL KNEE ARTHROPLASTY (Left) as a surgical intervention.  The patient's history has been reviewed, patient examined, no change in status, stable for surgery.  I have reviewed the patient's chart and labs.  Questions were answered to the patient's satisfaction.     [name]

## 2023-08-04 ENCOUNTER — Encounter (HOSPITAL_COMMUNITY): Payer: Self-pay | Admitting: Orthopedic Surgery

## 2023-08-04 DIAGNOSIS — M170 Bilateral primary osteoarthritis of knee: Secondary | ICD-10-CM | POA: Diagnosis not present

## 2023-08-04 LAB — BASIC METABOLIC PANEL
Anion gap: 8 (ref 5–15)
BUN: 13 mg/dL (ref 8–23)
CO2: 24 mmol/L (ref 22–32)
Calcium: 9 mg/dL (ref 8.9–10.3)
Chloride: 107 mmol/L (ref 98–111)
Creatinine, Ser: 0.6 mg/dL (ref 0.44–1.00)
GFR, Estimated: >60 mL/min (ref >60)
Glucose, Bld: 174 mg/dL — ABNORMAL HIGH (ref 70–99)
Potassium: 3.8 mmol/L (ref 3.5–5.1)
Sodium: 139 mmol/L (ref 135–145)

## 2023-08-04 LAB — CBC
HCT: 39.9 % (ref 36.0–46.0)
Hemoglobin: 12.3 g/dL (ref 12.0–15.0)
MCH: 27.9 pg (ref 26.0–34.0)
MCHC: 30.8 g/dL (ref 30.0–36.0)
MCV: 90.5 fL (ref 80.0–100.0)
Platelets: 204 10*3/uL (ref 150–400)
RBC: 4.41 MIL/uL (ref 3.87–5.11)
RDW: 14 % (ref 11.5–15.5)
WBC: 9 10*3/uL (ref 4.0–10.5)
nRBC: 0 % (ref 0.0–0.2)

## 2023-08-04 MED ORDER — KETOROLAC TROMETHAMINE 15 MG/ML IJ SOLN
7.5000 mg | Freq: Once | INTRAMUSCULAR | Status: AC
  Administered 2023-08-04: 7.5 mg via INTRAVENOUS
  Filled 2023-08-04: qty 1

## 2023-08-04 NOTE — Care Management Obs Status (Signed)
MEDICARE OBSERVATION STATUS NOTIFICATION   Patient Details  Name: Melanie Roth MRN: [id number] Date of Birth: 18-May-1956   Medicare Observation Status Notification Given:  Yes    Ewing Schlein, LCSW 08/04/2023, 11:12 AM

## 2023-08-04 NOTE — Progress Notes (Signed)
Subjective: 1 Day Post-Op Procedure(s) (LRB): LEFT TOTAL KNEE ARTHROPLASTY (Left) RIGHT KNEE CORTISONE INJECTION (Right) Patient reports pain as moderate.   Patient seen in rounds by Dr. Lequita Halt. Patient has no concerns other than pain in the left knee. Denies chest pain or SOB. Foley catheter removed this AM.  We will continue therapy today, ambulated 2' yesterday.  Objective: Vital signs in last 24 hours: Temp:  [97.3 F (36.3 C)-98.4 F (36.9 C)] 98.4 F (36.9 C) (09/24 0519) Pulse Rate:  [77-146] 96 (09/24 0519) Resp:  [10-19] 16 (09/24 0519) BP: (101-146)/(60-87) 101/60 (09/24 0519) SpO2:  [94 %-100 %] 99 % (09/24 0519)  Intake/Output from previous day:  Intake/Output Summary (Last 24 hours) at 08/04/2023 0851 Last data filed at 08/04/2023 0649 Gross per 24 hour  Intake 3317.02 ml  Output 3200 ml  Net 117.02 ml     Intake/Output this shift: No intake/output data recorded.  Labs: Recent Labs    08/04/23 0617  HGB 12.3   Recent Labs    08/04/23 0617  WBC 9.0  RBC 4.41  HCT 39.9  PLT 204   Recent Labs    08/04/23 0617  NA 139  K 3.8  CL 107  CO2 24  BUN 13  CREATININE 0.60  GLUCOSE 174*  CALCIUM 9.0   No results for input(s): "LABPT", "INR" in the last 72 hours.  Exam: General - Patient is Alert and Oriented Extremity - Neurologically intact Neurovascular intact Sensation intact distally Dorsiflexion/Plantar flexion intact Dressing - dressing C/D/I Motor Function - intact, moving foot and toes well on exam.   Past Medical History:  Diagnosis Date   Abnormal uterine bleeding (AUB)    Chronic constipation    Complication of anesthesia    01-13-2023  per pt she has small throat and mouth request use of very small tube since she has had issue previously with severe throat pain from tube put on prednisone , and difficult IV stick due to small veins   Coronary artery calcification seen on CAT scan 02/2022   cardiologist--- dr Antoine Poche;    Difficult intravenous access    per pt has previous issue with anyone getting IV,  told has small veins   Difficult intubation    01-14-2023  reviewed chart w/ Dr Curt Jews and w/ previous anesthesia record from surgery @ [facility] 10-15-2021 which shows diffucult intubation, pt not candidate for ambulatory surgery center   Eczema    Elevated coronary artery calcium score    Family history of adverse reaction to anesthesia    brother---   ponv   GERD (gastroesophageal reflux disease)    Hemorrhoids    external   History of adenomatous polyp of colon    History of external beam radiation therapy    left breast  11-27-2021  to  12-17-2021   History of Helicobacter pylori infection 01/30/2022   EGD bx --- per pt treated   History of kidney stones    Malignant neoplasm of central portion of left breast in female, estrogen receptor negative (HCC) 09/2021   oncology--- dr a hensely/ oncology surgeon-- dr Rachael Fee;   dx 10-08-2021;   10-15-2021 , Stage II  invasive metaplastic carcinoma (ER/PR --)  /p breast lumpectomy w/ node dissection;  completed radiation 12-17-2021   Mixed urge and stress incontinence    Multinodular thyroid 2016   nontoxic;   incidental finding on CT 2016;   last bx in epic 04-27-2018 benign follicular left nodule;   last ultrasound  in care everywhere 06-02-2019   Multiple thyroid nodules    OA (osteoarthritis)    knees, handa, all over   OAB (overactive bladder)    PONV (postoperative nausea and vomiting)    Wears glasses     Assessment/Plan: 1 Day Post-Op Procedure(s) (LRB): LEFT TOTAL KNEE ARTHROPLASTY (Left) RIGHT KNEE CORTISONE INJECTION (Right) Principal Problem:   OA (osteoarthritis) of knee Active Problems:   Osteoarthritis of left knee  Estimated body mass index is 37.79 kg/m as calculated from the following:   Height as of this encounter: 5\' 1"  (1.549 m).   Weight as of this encounter: 90.7 kg. Advance diet Up with therapy D/C IV fluids  DVT  Prophylaxis - Xarelto Weight bearing as tolerated. Continue therapy.  Plan is to go Home after hospital stay. Will require longer stay due to severely arthritic knee preop, will have slower progress with mobility.  Possible discharge with OPPT either tomorrow or Thursday.  Arther Abbott, PA-C Orthopedic Surgery [phone] 08/04/2023, 8:51 AM

## 2023-08-04 NOTE — Progress Notes (Signed)
Physical Therapy Treatment Patient Details Name: Melanie Roth MRN: [id number] DOB: 1956/03/24 Today's Date: 08/04/2023   History of Present Illness Pt is 67 yo female admitted on 08/03/23 for L TKA.  Pt with hx including but not limited to breast CA, OA, GERD    PT Comments  Pt reports some improvement in pain today.  She has fair quad activation and was able to ambulate 30' with RW and CGA.  Pt did have antalgic pattern and fatigued quickly.  Will continue to benefit from therapy prior to d/c home.  Noted in ortho note from this morning , likely plan to d/c tomorrow or Thursday.    If plan is discharge home, recommend the following: A little help with walking and/or transfers;A little help with bathing/dressing/bathroom;Assistance with cooking/housework;Help with stairs or ramp for entrance   Can travel by private vehicle        Equipment Recommendations  Other (comment) (recommended youth RW; however, noted pt already has rollator at home from insurane and declined private pay for RW)    Recommendations for Other Services       Precautions / Restrictions Precautions Precautions: Fall Restrictions LLE Weight Bearing: Weight bearing as tolerated     Mobility  Bed Mobility Overal bed mobility: Needs Assistance Bed Mobility: Supine to Sit     Supine to sit: Min assist     General bed mobility comments: light min A for L LE    Transfers Overall transfer level: Needs assistance Equipment used: Rolling walker (2 wheels) Transfers: Sit to/from Stand Sit to Stand: Min assist           General transfer comment: Cues for hand placement and L LE management    Ambulation/Gait Ambulation/Gait assistance: Contact guard assist Gait Distance (Feet): 30 Feet Assistive device: Rolling walker (2 wheels) Gait Pattern/deviations: Step-to pattern, Decreased stride length, Decreased weight shift to left Gait velocity: decreased     General Gait Details: Had chair  follow due to pain and easy fatigue; min cues for posture (pt wanting to lean on elbows); Pt with increased pain throughout walk-cued for smaller steps for improved pain control (reports some improvement)   Stairs             Wheelchair Mobility     Tilt Bed    Modified Rankin (Stroke Patients Only)       Balance Overall balance assessment: Needs assistance Sitting-balance support: No upper extremity supported Sitting balance-Leahy Scale: Good     Standing balance support: Bilateral upper extremity supported, Reliant on assistive device for balance Standing balance-Leahy Scale: Poor Standing balance comment: steady with RW                            Cognition Arousal: Alert Behavior During Therapy: WFL for tasks assessed/performed Overall Cognitive Status: Within Functional Limits for tasks assessed                                          Exercises Total Joint Exercises Ankle Circles/Pumps: AROM, Both, 10 reps, Supine Long Arc Quad: AAROM, Left, 10 reps, Seated Knee Flexion: AAROM, Left, 10 reps, Seated Goniometric ROM: L knee ~5 to 60 degrees    General Comments        Pertinent Vitals/Pain Pain Assessment Pain Assessment: Faces Faces Pain Scale: Hurts even more Pain Location: L knee Pain  Descriptors / Indicators: Discomfort, Aching, Grimacing, Guarding Pain Intervention(s): Limited activity within patient's tolerance, Monitored during session, Premedicated before session, Repositioned, Ice applied    Home Living                          Prior Function            PT Goals (current goals can now be found in the care plan section) Progress towards PT goals: Progressing toward goals    Frequency    7X/week      PT Plan      Co-evaluation              AM-PAC PT "6 Clicks" Mobility   Outcome Measure  Help needed turning from your back to your side while in a flat bed without using bedrails?: A  Little Help needed moving from lying on your back to sitting on the side of a flat bed without using bedrails?: A Little Help needed moving to and from a bed to a chair (including a wheelchair)?: A Little Help needed standing up from a chair using your arms (e.g., wheelchair or bedside chair)?: A Little Help needed to walk in hospital room?: A Little Help needed climbing 3-5 steps with a railing? : A Lot 6 Click Score: 17    End of Session Equipment Utilized During Treatment: Gait belt Activity Tolerance: Patient tolerated treatment well Patient left: with call bell/phone within reach;with bed alarm set;with chair alarm set;in chair Nurse Communication: Mobility status PT Visit Diagnosis: Other abnormalities of gait and mobility (R26.89);Pain;Muscle weakness (generalized) (M62.81) Pain - Right/Left: Left Pain - part of body: Knee     Time: 4098-1191 PT Time Calculation (min) (ACUTE ONLY): 13 min  Charges:    $Gait Training: 8-22 mins PT General Charges $$ ACUTE PT VISIT: 1 Visit                     Melanie Roth, PT Acute Rehab Services [facility] [phone]    Melanie Roth 08/04/2023, 11:53 AM

## 2023-08-04 NOTE — Progress Notes (Signed)
Physical Therapy Treatment Patient Details Name: Melanie Roth MRN: [id number] DOB: 12-22-1955 Today's Date: 08/04/2023   History of Present Illness Pt is 67 yo female admitted on 08/03/23 for L TKA.  Pt with hx including but not limited to breast CA, OA, GERD    PT Comments  Pt making gradual progress with limitations due to pain.  She was able to ambulate 80' with RW and CGA and antalgic gait pattern.  For pain control with exercises , limited reps, limited ROM, and provided assistance as needed.   Did hold on stair training as pt is not discharging today and for pain control.  Pt is motivated and is expected to continue to progress with therapy.     If plan is discharge home, recommend the following: A little help with walking and/or transfers;A little help with bathing/dressing/bathroom;Assistance with cooking/housework;Help with stairs or ramp for entrance   Can travel by private vehicle        Equipment Recommendations  Other (comment) (recommended youth RW; however, noted pt already has rollator at home from insurane and declined private pay for RW)    Recommendations for Other Services       Precautions / Restrictions Precautions Precautions: Fall;Knee Restrictions LLE Weight Bearing: Weight bearing as tolerated     Mobility  Bed Mobility Overal bed mobility: Needs Assistance Bed Mobility: Sit to Supine     Supine to sit: Min assist Sit to supine: Min assist   General bed mobility comments: light min A for L LE    Transfers Overall transfer level: Needs assistance Equipment used: Rolling walker (2 wheels) Transfers: Sit to/from Stand Sit to Stand: Contact guard assist           General transfer comment: Cues for hand placement and L LE management    Ambulation/Gait Ambulation/Gait assistance: Contact guard assist Gait Distance (Feet): 50 Feet Assistive device: Rolling walker (2 wheels) Gait Pattern/deviations: Step-to pattern, Decreased  stride length, Decreased weight shift to left Gait velocity: decreased     General Gait Details: Cues for smaller steps for pain control   Stairs             Wheelchair Mobility     Tilt Bed    Modified Rankin (Stroke Patients Only)       Balance Overall balance assessment: Needs assistance Sitting-balance support: No upper extremity supported Sitting balance-Leahy Scale: Good     Standing balance support: Bilateral upper extremity supported, Reliant on assistive device for balance Standing balance-Leahy Scale: Poor Standing balance comment: steady with RW                            Cognition Arousal: Alert Behavior During Therapy: WFL for tasks assessed/performed Overall Cognitive Status: Within Functional Limits for tasks assessed                                          Exercises Total Joint Exercises Ankle Circles/Pumps: AROM, Both, 10 reps, Supine Quad Sets: AROM, Both, 10 reps, Supine Heel Slides: AAROM, Left, 5 reps, Supine Hip ABduction/ADduction: AAROM, Left, 5 reps, Supine Long Arc Quad: AAROM, Left, 10 reps, Seated Knee Flexion: AAROM, Left, 10 reps, Seated Goniometric ROM: L knee ~5 to 80 degrees    General Comments        Pertinent Vitals/Pain Pain Assessment Pain Assessment: 0-10 Pain Score:  6  Faces Pain Scale: Hurts even more Pain Location: L knee Pain Descriptors / Indicators: Discomfort, Aching, Grimacing, Guarding Pain Intervention(s): Limited activity within patient's tolerance, Monitored during session, Premedicated before session, Repositioned, Patient requesting pain meds-RN notified    Home Living                          Prior Function            PT Goals (current goals can now be found in the care plan section) Progress towards PT goals: Progressing toward goals    Frequency    7X/week      PT Plan      Co-evaluation              AM-PAC PT "6 Clicks" Mobility    Outcome Measure  Help needed turning from your back to your side while in a flat bed without using bedrails?: A Little Help needed moving from lying on your back to sitting on the side of a flat bed without using bedrails?: A Little Help needed moving to and from a bed to a chair (including a wheelchair)?: A Little Help needed standing up from a chair using your arms (e.g., wheelchair or bedside chair)?: A Little Help needed to walk in hospital room?: A Little Help needed climbing 3-5 steps with a railing? : A Lot 6 Click Score: 17    End of Session Equipment Utilized During Treatment: Gait belt Activity Tolerance: Patient tolerated treatment well Patient left: with call bell/phone within reach;with bed alarm set;with chair alarm set;in chair Nurse Communication: Mobility status;Patient requests pain meds PT Visit Diagnosis: Other abnormalities of gait and mobility (R26.89);Pain;Muscle weakness (generalized) (M62.81) Pain - Right/Left: Left Pain - part of body: Knee     Time: 7829-5621 PT Time Calculation (min) (ACUTE ONLY): 27 min  Charges:    $Gait Training: 8-22 mins $Therapeutic Exercise: 8-22 mins PT General Charges $$ ACUTE PT VISIT: 1 Visit                     Anise Salvo, PT Acute Rehab [facility] [phone]    Rayetta Humphrey 08/04/2023, 3:23 PM

## 2023-08-04 NOTE — TOC Transition Note (Signed)
Transition of Care [facility]) - CM/SW Discharge Note  Patient Details  Name: Melanie Roth MRN: [id number] Date of Birth: 1956-08-21  Transition of Care [facility]) CM/SW Contact:  Ewing Schlein, LCSW Phone Number: 08/04/2023, 9:56 AM  Clinical Narrative: Patient is expected to discharge home after working with PT. CSW met with patient to confirm discharge plan. Patient will go home with OPPT at [facility]. Patient reported she has a rollator, which she got through insurance earlier this year, and a scooter at home. Patient declined to private pay for a rolling walker at this time. TOC signing off.   Final next level of care: OP Rehab Barriers to Discharge: No Barriers Identified  Patient Goals and CMS Choice Choice offered to / list presented to : NA  Discharge Plan and Services Additional resources added to the After Visit Summary for         DME Arranged: N/A DME Agency: NA  Social Determinants of Health (SDOH) Interventions SDOH Screenings   Food Insecurity: No Food Insecurity (08/03/2023)  Housing: Low Risk  (08/03/2023)  Transportation Needs: No Transportation Needs (08/03/2023)  Utilities: Not At Risk (08/03/2023)  Social Connections: Unknown (03/21/2022)   Received from [facility], [facility]  Tobacco Use: Medium Risk (08/03/2023)   Readmission Risk Interventions     No data to display

## 2023-08-05 DIAGNOSIS — M170 Bilateral primary osteoarthritis of knee: Secondary | ICD-10-CM | POA: Diagnosis not present

## 2023-08-05 LAB — CBC
HCT: 36.3 % (ref 36.0–46.0)
Hemoglobin: 11.7 g/dL — ABNORMAL LOW (ref 12.0–15.0)
MCH: 27.7 pg (ref 26.0–34.0)
MCHC: 32.2 g/dL (ref 30.0–36.0)
MCV: 86 fL (ref 80.0–100.0)
Platelets: 225 10*3/uL (ref 150–400)
RBC: 4.22 MIL/uL (ref 3.87–5.11)
RDW: 14.1 % (ref 11.5–15.5)
WBC: 13.9 10*3/uL — ABNORMAL HIGH (ref 4.0–10.5)
nRBC: 0 % (ref 0.0–0.2)

## 2023-08-05 MED ORDER — METHOCARBAMOL 500 MG PO TABS: 500.0000 mg | ORAL_TABLET | Freq: Four times a day (QID) | ORAL | 0 refills | Status: AC | PRN

## 2023-08-05 MED ORDER — ONDANSETRON HCL 4 MG PO TABS: 4.0000 mg | ORAL_TABLET | Freq: Four times a day (QID) | ORAL | 0 refills | Status: AC | PRN

## 2023-08-05 MED ORDER — RIVAROXABAN 10 MG PO TABS: 10.0000 mg | ORAL_TABLET | Freq: Every day | ORAL | 0 refills | Status: AC

## 2023-08-05 MED ORDER — GABAPENTIN 300 MG PO CAPS: 300.0000 mg | ORAL_CAPSULE | ORAL | 0 refills | Status: AC

## 2023-08-05 MED ORDER — HYDROMORPHONE HCL 2 MG PO TABS: 2.0000 mg | ORAL_TABLET | Freq: Four times a day (QID) | ORAL | 0 refills | Status: AC | PRN

## 2023-08-05 NOTE — Progress Notes (Signed)
Physical Therapy Treatment Patient Details Name: Melanie Roth MRN: [id number] DOB: 03-10-1956 Today's Date: 08/05/2023   History of Present Illness Pt is 67 yo female admitted on 08/03/23 for L TKA.  Pt with hx including but not limited to breast CA, OA, GERD    PT Comments  Pt with increased pain and slower progress today.  She was only able to ambulate 12'x2(to/from restroom) with CGA and unsafe gait pattern (leaning onto elbows) requiring cues to correct.  Pt has stairs to enter her home and does not have the foot clearance or pain control to perform.  Pt needs further therapy in order to meet goals for safe discharge home.  Plan to f/u in afternoon, but suspect pt could likely benefit from further therapy to progress.    If plan is discharge home, recommend the following: A little help with walking and/or transfers;A little help with bathing/dressing/bathroom;Assistance with cooking/housework;Help with stairs or ramp for entrance   Can travel by private vehicle        Equipment Recommendations   (recommended youth RW; however, noted pt already has rollator at home from insurane and declined private pay for RW)    Recommendations for Other Services       Precautions / Restrictions Precautions Precautions: Fall;Knee Restrictions Weight Bearing Restrictions: Yes LLE Weight Bearing: Weight bearing as tolerated     Mobility  Bed Mobility Overal bed mobility: Needs Assistance Bed Mobility: Supine to Sit     Supine to sit: Min assist     General bed mobility comments: Assist for L LE; increased time and effort from pt    Transfers Overall transfer level: Needs assistance Equipment used: Rolling walker (2 wheels) Transfers: Sit to/from Stand Sit to Stand: Min assist, From elevated surface           General transfer comment: Cues for hand placement and L LE management; required bed elevated    Ambulation/Gait Ambulation/Gait assistance: Contact guard  assist Gait Distance (Feet): 12 Feet (12'x2) Assistive device: Rolling walker (2 wheels) Gait Pattern/deviations: Step-to pattern, Decreased stride length, Decreased weight shift to left Gait velocity: decreased     General Gait Details: Pt ambulating to and from bathroom with very antalgic pattern.  She frequently leaned over onto elbow on RW.  Needed cues for hand placement, posture, and smaller steps for pain control.   Stairs             Wheelchair Mobility     Tilt Bed    Modified Rankin (Stroke Patients Only)       Balance Overall balance assessment: Needs assistance Sitting-balance support: No upper extremity supported Sitting balance-Leahy Scale: Good     Standing balance support: Bilateral upper extremity supported, Reliant on assistive device for balance Standing balance-Leahy Scale: Poor Standing balance comment: steady with RW                            Cognition Arousal: Alert Behavior During Therapy: WFL for tasks assessed/performed Overall Cognitive Status: Within Functional Limits for tasks assessed                                 General Comments: Overall WFL -however also distracted by pain.  Clearly discussed with pt her slower progress with therapy, difficulties with mobility/pain, and concern from therapist about going home and at least needing to work with therapy 1 more time  if not another day and at end of discussion pt talking about prepping to go home now.  Reiterating need for further therapy first and pt verbalized understanding        Exercises Total Joint Exercises Ankle Circles/Pumps: AROM, Both, 10 reps, Supine Quad Sets: AROM, Both, 10 reps, Supine Heel Slides:  (held due to pain) Hip ABduction/ADduction:  (held due to pain) Long Arc Quad: AAROM, Left, Seated, 5 reps (mod A for pain control) Knee Flexion: AAROM, Left, 10 reps, Seated (limited ROM for pain control) Goniometric ROM: L knee ~5 to 60  degrees    General Comments   Educated on safe ice use, no pivots, car transfers, resting with leg straight.  Also, encouraged walking every 1-2 hours during day. Educated on HEP with focus on mobility the first weeks. Discussed doing exercises within pain control and if pain increasing could decreased ROM, reps, and stop exercises as needed. Encouraged to perform quad sets and ankle pumps frequently for blood flow and to promote full knee extension.  Also, educated on slower progress and increased pain limiting mobility and safety.  Discussed not yet meeting therapy goals for safe discharge and would benefit at least from another session this afternoon if not another day too.       Pertinent Vitals/Pain Pain Assessment Pain Assessment: 0-10 Pain Score: 6  Pain Location: L knee Pain Descriptors / Indicators: Discomfort, Aching, Grimacing, Guarding Pain Intervention(s): Limited activity within patient's tolerance, Monitored during session, Premedicated before session, Repositioned, Ice applied    Home Living                          Prior Function            PT Goals (current goals can now be found in the care plan section) Progress towards PT goals: Not progressing toward goals - comment (limited by pain)    Frequency    7X/week      PT Plan      Co-evaluation              AM-PAC PT "6 Clicks" Mobility   Outcome Measure  Help needed turning from your back to your side while in a flat bed without using bedrails?: A Little Help needed moving from lying on your back to sitting on the side of a flat bed without using bedrails?: A Little Help needed moving to and from a bed to a chair (including a wheelchair)?: A Little Help needed standing up from a chair using your arms (e.g., wheelchair or bedside chair)?: A Little Help needed to walk in hospital room?: A Lot Help needed climbing 3-5 steps with a railing? : A Lot 6 Click Score: 16    End of Session  Equipment Utilized During Treatment: Gait belt Activity Tolerance: Patient limited by pain Patient left: with call bell/phone within reach;with chair alarm set;in chair Nurse Communication: Mobility status;Other (comment) (need for further therapy, pt request sheet changed , and room cleaned (notified EVS about room)) PT Visit Diagnosis: Other abnormalities of gait and mobility (R26.89);Pain;Muscle weakness (generalized) (M62.81) Pain - Right/Left: Left Pain - part of body: Knee     Time: 1136-1212 PT Time Calculation (min) (ACUTE ONLY): 36 min  Charges:    $Gait Training: 8-22 mins $Therapeutic Exercise: 8-22 mins PT General Charges $$ ACUTE PT VISIT: 1 Visit  Anise Salvo, PT Acute Rehab [facility] [phone]    Rayetta Humphrey 08/05/2023, 12:22 PM

## 2023-08-05 NOTE — Plan of Care (Signed)
Problem: Activity: Goal: Ability to avoid complications of mobility impairment will improve Outcome: Progressing   Problem: Clinical Measurements: Goal: Postoperative complications will be avoided or minimized Outcome: Progressing   Problem: Pain Management: Goal: Pain level will decrease with appropriate interventions Outcome: Progressing   Problem: Activity: Goal: Risk for activity intolerance will decrease Outcome: Progressing

## 2023-08-05 NOTE — Progress Notes (Signed)
Subjective: 2 Days Post-Op Procedure(s) (LRB): LEFT TOTAL KNEE ARTHROPLASTY (Left) RIGHT KNEE CORTISONE INJECTION (Right) Patient seen in rounds by Dr. Lequita Halt. Patient is  well but reports some GI discomfort. + flatus.  Denies N/V. Denies SOB or chest pain. Patient reports pain as moderate.    Objective: Vital signs in last 24 hours: Temp:  [97.5 F (36.4 C)-98.5 F (36.9 C)] 98.2 F (36.8 C) (09/25 0558) Pulse Rate:  [78-95] 86 (09/25 0558) Resp:  [15-18] 17 (09/25 0558) BP: (108-134)/(56-80) 134/80 (09/25 0558) SpO2:  [93 %-100 %] 93 % (09/25 0558)  Intake/Output from previous day:  Intake/Output Summary (Last 24 hours) at 08/05/2023 0801 Last data filed at 08/05/2023 0630 Gross per 24 hour  Intake 1467.46 ml  Output 450 ml  Net 1017.46 ml    Intake/Output this shift: No intake/output data recorded.  Labs: Recent Labs    08/04/23 0617 08/05/23 0352  HGB 12.3 11.7*   Recent Labs    08/04/23 0617 08/05/23 0352  WBC 9.0 13.9*  RBC 4.41 4.22  HCT 39.9 36.3  PLT 204 225   Recent Labs    08/04/23 0617  NA 139  K 3.8  CL 107  CO2 24  BUN 13  CREATININE 0.60  GLUCOSE 174*  CALCIUM 9.0   No results for input(s): "LABPT", "INR" in the last 72 hours.  Exam: General - Patient is Alert and Oriented GI - Abdomen is soft and non-tender Extremity - Neurologically intact Neurovascular intact Sensation intact distally Dorsiflexion/Plantar flexion intact Dressing/Incision - clean, dry, no drainage Motor Function - intact, moving foot and toes well on exam.  Past Medical History:  Diagnosis Date   Abnormal uterine bleeding (AUB)    Chronic constipation    Complication of anesthesia    01-13-2023  per pt she has small throat and mouth request use of very small tube since she has had issue previously with severe throat pain from tube put on prednisone , and difficult IV stick due to small veins   Coronary artery calcification seen on CAT scan 02/2022    cardiologist--- dr Antoine Poche;   Difficult intravenous access    per pt has previous issue with anyone getting IV,  told has small veins   Difficult intubation    01-14-2023  reviewed chart w/ Dr Curt Jews and w/ previous anesthesia record from surgery @ [facility] 10-15-2021 which shows diffucult intubation, pt not candidate for ambulatory surgery center   Eczema    Elevated coronary artery calcium score    Family history of adverse reaction to anesthesia    brother---   ponv   GERD (gastroesophageal reflux disease)    Hemorrhoids    external   History of adenomatous polyp of colon    History of external beam radiation therapy    left breast  11-27-2021  to  12-17-2021   History of Helicobacter pylori infection 01/30/2022   EGD bx --- per pt treated   History of kidney stones    Malignant neoplasm of central portion of left breast in female, estrogen receptor negative (HCC) 09/2021   oncology--- dr a hensely/ oncology surgeon-- dr Rachael Fee;   dx 10-08-2021;   10-15-2021 , Stage II  invasive metaplastic carcinoma (ER/PR --)  /p breast lumpectomy w/ node dissection;  completed radiation 12-17-2021   Mixed urge and stress incontinence    Multinodular thyroid 2016   nontoxic;   incidental finding on CT 2016;   last bx in epic 04-27-2018 benign follicular left  nodule;   last ultrasound in care everywhere 06-02-2019   Multiple thyroid nodules    OA (osteoarthritis)    knees, handa, all over   OAB (overactive bladder)    PONV (postoperative nausea and vomiting)    Wears glasses     Assessment/Plan: 2 Days Post-Op Procedure(s) (LRB): LEFT TOTAL KNEE ARTHROPLASTY (Left) RIGHT KNEE CORTISONE INJECTION (Right) Principal Problem:   OA (osteoarthritis) of knee Active Problems:   Osteoarthritis of left knee  Estimated body mass index is 37.79 kg/m as calculated from the following:   Height as of this encounter: 5\' 1"  (1.549 m).   Weight as of this encounter: 90.7 kg.  DVT Prophylaxis -  Xarelto Weight-bearing as tolerated.  Continue physical therapy. Expected discharge home today pending progress and symptom management. Scheduled for OPPT at [facility]. Follow-up in clinic in 2 weeks.  The PDMP database was reviewed today prior to any opioid medications being prescribed to this patient.  R. Arcola Jansky, PA-C Orthopedic Surgery 08/05/2023, 8:01 AM

## 2023-08-05 NOTE — Progress Notes (Signed)
Physical Therapy Treatment Patient Details Name: Melanie Roth MRN: [id number] DOB: [birth year] Today's Date: 08/05/2023   History of Present Illness Pt is 67 yo female admitted on 08/03/23 for L TKA.  Pt with hx including but not limited to breast CA, OA, GERD    PT Comments  Pt did have improved pain control and progressed mobility this afternoon.  She was able to ambulate 46' and performed stairs similar to home set up (cane L, HHA R, 3 small steps).   Pt has very antalgic pattern and transfers but is stable. She does still have pain but reports improved and she is comfortable (and would like to go home) in going home with current pain control and mobility.  Reports will have assist from spouse.  Provided with written HEP and tips/instructions this morning.  Pt demonstrates safe gait & transfers in order to return home from PT perspective once discharged by MD.  While in hospital, will continue to benefit from PT for skilled therapy to advance mobility and exercises.  Pt with several questions involving medications - notified RN.      If plan is discharge home, recommend the following: A little help with walking and/or transfers;A little help with bathing/dressing/bathroom;Assistance with cooking/housework;Help with stairs or ramp for entrance   Can travel by private vehicle        Equipment Recommendations   (recommended youth RW; however, noted pt already has rollator at home from insurane and declined private pay for RW)    Recommendations for Other Services       Precautions / Restrictions Precautions Precautions: Fall;Knee Restrictions LLE Weight Bearing: Weight bearing as tolerated     Mobility  Bed Mobility Overal bed mobility: Needs Assistance Bed Mobility: Supine to Sit, Sit to Supine     Supine to sit: Min assist Sit to supine: Min assist   General bed mobility comments: Assist for L LE; increased time and effort from pt    Transfers Overall transfer  level: Needs assistance Equipment used: Rolling walker (2 wheels) Transfers: Sit to/from Stand Sit to Stand: Contact guard assist, Min assist           General transfer comment: CGA from chair - performed x 2 with good hand placement; min A from low bed; cues for safe sitting technique    Ambulation/Gait Ambulation/Gait assistance: Contact guard assist Gait Distance (Feet): 60 Feet Assistive device: Rolling walker (2 wheels) Gait Pattern/deviations: Step-to pattern, Decreased stride length, Decreased weight shift to left Gait velocity: decreased     General Gait Details: Antalgic but steady pattern , did stand straight this afternoon, cues for small step   Stairs Stairs: Yes Stairs assistance: Contact guard assist, Min assist Stair Management: Step to pattern, Forwards Number of Stairs: 6 General stair comments: Started with 3 steps with bil rails and CGA; progressed to 3 steps with L cane and R HHA with min A ; pt tolerated well, no LOB, did 4" steps (reports hers are low at home); did not attempt with RW backward as pt has rollator at home and declined RW   Wheelchair Mobility     Tilt Bed    Modified Rankin (Stroke Patients Only)       Balance Overall balance assessment: Needs assistance Sitting-balance support: No upper extremity supported Sitting balance-Leahy Scale: Good     Standing balance support: Bilateral upper extremity supported, Reliant on assistive device for balance Standing balance-Leahy Scale: Poor Standing balance comment: steady with RW  Cognition Arousal: Alert Behavior During Therapy: WFL for tasks assessed/performed Overall Cognitive Status: Within Functional Limits for tasks assessed                                 General Comments: Overall is [facility] but is forgetful at times.  Asked repeated questions, asked questions about where to pick up meds (RN reports just discussed with pt).         Exercises Total Joint Exercises Ankle Circles/Pumps: AROM, Both, 10 reps, Supine Quad Sets: AROM, Both, 10 reps, Supine Heel Slides:  (held due to pain) Hip ABduction/ADduction:  (held due to pain) Long Arc Quad:  (held due to pain) Knee Flexion: AAROM, Left, 10 reps, Seated (limited ROM for pain control) Goniometric ROM: L knee ~5 to 60 degrees    General Comments   Educated on safe ice use, no pivots, car transfers, resting with leg straight.  Also, encouraged walking every 1-2 hours during day. Educated on HEP with focus on mobility the first weeks. Discussed doing exercises within pain control and if pain increasing could decreased ROM, reps, and stop exercises as needed. Encouraged to perform quad sets and ankle pumps frequently for blood flow and to promote full knee extension.  Pt reports she will likely go home and rest a lot with trips to bathroom being her activity - educated/encouraged pt that she really needs to be up every 1-2 hours and walking further than just bathroom.  Also, encouraged to do exercises as able - particularly ankle pumps, quad sets, and seated knee flexion.  Discussed holding off on SLR, heel slides, and supine hip abd if too painful.       Pertinent Vitals/Pain Pain Assessment Pain Assessment: 0-10 Pain Score: 6  Pain Location: L knee Pain Descriptors / Indicators: Discomfort, Aching, Grimacing, Guarding Pain Intervention(s): Limited activity within patient's tolerance, Monitored during session, Premedicated before session, Repositioned, Ice applied    Home Living                          Prior Function            PT Goals (current goals can now be found in the care plan section) Progress towards PT goals: Progressing toward goals    Frequency    7X/week      PT Plan      Co-evaluation              AM-PAC PT "6 Clicks" Mobility   Outcome Measure  Help needed turning from your back to your side while in a flat bed  without using bedrails?: A Little Help needed moving from lying on your back to sitting on the side of a flat bed without using bedrails?: A Little Help needed moving to and from a bed to a chair (including a wheelchair)?: A Little Help needed standing up from a chair using your arms (e.g., wheelchair or bedside chair)?: A Little Help needed to walk in hospital room?: A Little Help needed climbing 3-5 steps with a railing? : A Little 6 Click Score: 18    End of Session Equipment Utilized During Treatment: Gait belt Activity Tolerance: Patient tolerated treatment well Patient left: with call bell/phone within reach;in bed;with bed alarm set (ice applied, placed bed in [place] to elevate legs as pt had c/o edema (this automatically elevates bed height, rails in place)) Nurse Communication: Mobility status  PT Visit Diagnosis: Other abnormalities of gait and mobility (R26.89);Pain;Muscle weakness (generalized) (M62.81) Pain - Right/Left: Left Pain - part of body: Knee     Time: 1610-9604 PT Time Calculation (min) (ACUTE ONLY): 30 min  Charges:    $Gait Training: 8-22 mins $Therapeutic Exercise: 8-22 mins PT General Charges $$ ACUTE PT VISIT: 1 Visit                     Anise Salvo, PT Acute Rehab [facility] [phone]    Rayetta Humphrey 08/05/2023, 4:16 PM

## 2023-08-05 NOTE — Progress Notes (Signed)
Pt discharged home with spouse in stable condition. Discharge instructions given. Scripts sent to pharmacy of choice. No immediate questions or concerns at this time. Discharged from unit via wheelchair.

## 2023-08-06 NOTE — Discharge Summary (Signed)
Physician Discharge Summary   Patient ID: Melanie Roth MRN: [id number] DOB/AGE: [birth year] 67 y.o.  Admit date: 08/03/2023 Discharge date: 08/05/2023  Primary Diagnosis: Osteoarthritis bilateral knees  Admission Diagnoses:  Past Medical History:  Diagnosis Date   Abnormal uterine bleeding (AUB)    Chronic constipation    Complication of anesthesia    01-13-2023  per pt she has small throat and mouth request use of very small tube since she has had issue previously with severe throat pain from tube put on prednisone , and difficult IV stick due to small veins   Coronary artery calcification seen on CAT scan 02/2022   cardiologist--- dr Antoine Poche;   Difficult intravenous access    per pt has previous issue with anyone getting IV,  told has small veins   Difficult intubation    01-14-2023  reviewed chart w/ Dr Curt Jews and w/ previous anesthesia record from surgery @ [facility] 10-15-2021 which shows diffucult intubation, pt not candidate for ambulatory surgery center   Eczema    Elevated coronary artery calcium score    Family history of adverse reaction to anesthesia    brother---   ponv   GERD (gastroesophageal reflux disease)    Hemorrhoids    external   History of adenomatous polyp of colon    History of external beam radiation therapy    left breast  11-27-2021  to  12-17-2021   History of Helicobacter pylori infection 01/30/2022   EGD bx --- per pt treated   History of kidney stones    Malignant neoplasm of central portion of left breast in female, estrogen receptor negative (HCC) 09/2021   oncology--- dr a hensely/ oncology surgeon-- dr Rachael Fee;   dx 10-08-2021;   10-15-2021 , Stage II  invasive metaplastic carcinoma (ER/PR --)  /p breast lumpectomy w/ node dissection;  completed radiation 12-17-2021   Mixed urge and stress incontinence    Multinodular thyroid 2016   nontoxic;   incidental finding on CT 2016;   last bx in epic 04-27-2018 benign follicular left  nodule;   last ultrasound in care everywhere 06-02-2019   Multiple thyroid nodules    OA (osteoarthritis)    knees, handa, all over   OAB (overactive bladder)    PONV (postoperative nausea and vomiting)    Wears glasses    Discharge Diagnoses:   Principal Problem:   OA (osteoarthritis) of knee Active Problems:   Osteoarthritis of left knee  Estimated body mass index is 37.79 kg/m as calculated from the following:   Height as of this encounter: 5\' 1"  (1.549 m).   Weight as of this encounter: 90.7 kg.  Procedure:  Procedure(s) (LRB): LEFT TOTAL KNEE ARTHROPLASTY (Left) RIGHT KNEE CORTISONE INJECTION (Right)   Consults: None  HPI: Melanie Roth is a 67 y.o. year old female with end stage OA of her left knee with progressively worsening pain and dysfunction. She has constant pain, with activity and at rest and significant functional deficits with difficulties even with ADLs. She has had extensive non-op management including analgesics, injections of cortisone and viscosupplements, and home exercise program, but remains in significant pain with significant dysfunction. Radiographs show bone on bone arthritis medial and patellofemoral. She presents now for left Total Knee Arthroplasty.  She also has advanced osteoarthritis of her right knee and requests an injection  Laboratory Data: Admission on 08/03/2023, Discharged on 08/05/2023  Component Date Value Ref Range Status   WBC 08/04/2023 9.0  4.0 - 10.5 K/uL Final   RBC  08/04/2023 4.41  3.87 - 5.11 MIL/uL Final   Hemoglobin 08/04/2023 12.3  12.0 - 15.0 g/dL Final   HCT 57/32/2025 39.9  36.0 - 46.0 % Final   MCV 08/04/2023 90.5  80.0 - 100.0 fL Final   MCH 08/04/2023 27.9  26.0 - 34.0 pg Final   MCHC 08/04/2023 30.8  30.0 - 36.0 g/dL Final   RDW 42/70/6237 14.0  11.5 - 15.5 % Final   Platelets 08/04/2023 204  150 - 400 K/uL Final   nRBC 08/04/2023 0.0  0.0 - 0.2 % Final   Performed at [facility], [address] [place], [place] [address]   Sodium 08/04/2023 139  135 - 145 mmol/L Final   Potassium 08/04/2023 3.8  3.5 - 5.1 mmol/L Final   Chloride 08/04/2023 107  98 - 111 mmol/L Final   CO2 08/04/2023 24  22 - 32 mmol/L Final   Glucose, Bld 08/04/2023 174 (H)  70 - 99 mg/dL Final   Glucose reference range applies only to samples taken after fasting for at least 8 hours.   BUN 08/04/2023 13  8 - 23 mg/dL Final   Creatinine, Ser 08/04/2023 0.60  0.44 - 1.00 mg/dL Final   Calcium 51/76/1607 9.0  8.9 - 10.3 mg/dL Final   GFR, Estimated 08/04/2023 >60  >60 mL/min Final   Comment: (NOTE) Calculated using the CKD-EPI Creatinine Equation (2021)    Anion gap 08/04/2023 8  5 - 15 Final   Performed at [facility], [address] [place], [place] [address]   WBC 08/05/2023 13.9 (H)  4.0 - 10.5 K/uL Final   RBC 08/05/2023 4.22  3.87 - 5.11 MIL/uL Final   Hemoglobin 08/05/2023 11.7 (L)  12.0 - 15.0 g/dL Final   HCT 26/94/8546 36.3  36.0 - 46.0 % Final   MCV 08/05/2023 86.0  80.0 - 100.0 fL Final   MCH 08/05/2023 27.7  26.0 - 34.0 pg Final   MCHC 08/05/2023 32.2  30.0 - 36.0 g/dL Final   RDW 27/01/5008 14.1  11.5 - 15.5 % Final   Platelets 08/05/2023 225  150 - 400 K/uL Final   nRBC 08/05/2023 0.0  0.0 - 0.2 % Final   Performed at [facility], [address], [place], [place] [address]  Hospital Outpatient Visit on 07/23/2023  Component Date Value Ref Range Status   MRSA, PCR 07/23/2023 NEGATIVE  NEGATIVE Final   Staphylococcus aureus 07/23/2023 NEGATIVE  NEGATIVE Final   Comment: (NOTE) The Xpert SA Assay (FDA approved for NASAL specimens in patients 57 years of age and older), is one component of a comprehensive surveillance program. It is not intended to diagnose infection nor to guide or monitor treatment. Performed at [facility], [address], [place], [place] [address]    Sodium 07/23/2023 139  135 - 145 mmol/L Final    Potassium 07/23/2023 4.3  3.5 - 5.1 mmol/L Final   Chloride 07/23/2023 105  98 - 111 mmol/L Final   CO2 07/23/2023 23  22 - 32 mmol/L Final   Glucose, Bld 07/23/2023 111 (H)  70 - 99 mg/dL Final   Glucose reference range applies only to samples taken after fasting for at least 8 hours.   BUN 07/23/2023 16  8 - 23 mg/dL Final   Creatinine, Ser 07/23/2023 0.56  0.44 - 1.00 mg/dL Final   Calcium 69/67/8938 9.6  8.9 - 10.3 mg/dL Final   GFR, Estimated 07/23/2023 >60  >60 mL/min  Final   Comment: (NOTE) Calculated using the CKD-EPI Creatinine Equation (2021)    Anion gap 07/23/2023 11  5 - 15 Final   Performed at [facility], [address] [place], [place] [address]   WBC 07/23/2023 6.2  4.0 - 10.5 K/uL Final   RBC 07/23/2023 5.00  3.87 - 5.11 MIL/uL Final   Hemoglobin 07/23/2023 13.7  12.0 - 15.0 g/dL Final   HCT 19/14/7829 45.1  36.0 - 46.0 % Final   MCV 07/23/2023 90.2  80.0 - 100.0 fL Final   MCH 07/23/2023 27.4  26.0 - 34.0 pg Final   MCHC 07/23/2023 30.4  30.0 - 36.0 g/dL Final   RDW 56/21/3086 14.2  11.5 - 15.5 % Final   Platelets 07/23/2023 242  150 - 400 K/uL Final   nRBC 07/23/2023 0.0  0.0 - 0.2 % Final   Performed at [facility], [address], [place], [place] [address]     X-Rays:No results found.  EKG: Orders placed or performed during the hospital encounter of 07/23/23   EKG 12 lead per protocol   EKG 12 lead per protocol     Hospital Course: Janeliz Runkle is a 67 y.o. who was admitted to [facility]. They were brought to the operating room on 08/03/2023 and underwent Procedure(s): LEFT TOTAL KNEE ARTHROPLASTY RIGHT KNEE CORTISONE INJECTION.  Patient tolerated the procedure well and was later transferred to the recovery room and then to the orthopaedic floor for postoperative care. They were given PO and IV analgesics for pain control following their surgery. They were given 24 hours of postoperative  antibiotics of  Anti-infectives (From admission, onward)    Start     Dose/Rate Route Frequency Ordered Stop   08/03/23 1600  ceFAZolin (ANCEF) IVPB 2g/100 mL premix        2 g 200 mL/hr over 30 Minutes Intravenous Every 6 hours 08/03/23 1440 08/03/23 2250   08/03/23 0815  ceFAZolin (ANCEF) IVPB 2g/100 mL premix        2 g 200 mL/hr over 30 Minutes Intravenous On call to O.R. 08/03/23 9629 08/03/23 1104     and started on DVT prophylaxis in the form of Xarelto.   PT and OT were ordered for total joint protocol. Discharge planning consulted to help with post-op disposition and equipment needs. Patient had a fair night on the evening of surgery. They started to get up OOB with physical therapy on POD #0. Continued to work with physical therapy into POD #2. Patient was seen during rounds on day two and was ready to go home pending progress with physical therapy. Patient worked with physical therapy for a total of 5 sessions and was meeting their goals. Dressing was changed and the incision was C/D/I.  They were discharged home later that day in stable condition.  Diet: Regular diet Activity: WBAT Follow-up: in 2 weeks Disposition: Home Discharged Condition: stable   Discharge Instructions     Call MD / Call 911   Complete by: As directed    If you experience chest pain or shortness of breath, CALL 911 and be transported to the hospital emergency room.  If you develope a fever above 101 F, pus (white drainage) or increased drainage or redness at the wound, or calf pain, call your surgeon's office.   Change dressing   Complete by: As directed    You may remove the bulky bandage (ACE wrap and gauze) two days after surgery. You will  have an adhesive waterproof bandage underneath. Leave this in place until your first follow-up appointment.   Constipation Prevention   Complete by: As directed    Drink plenty of fluids.  Prune juice may be helpful.  You may use a stool softener, such as Colace  (over the counter) 100 mg twice a day.  Use MiraLax (over the counter) for constipation as needed.   Diet - low sodium heart healthy   Complete by: As directed    Do not put a pillow under the knee. Place it under the heel.   Complete by: As directed    Driving restrictions   Complete by: As directed    No driving for two weeks   Post-operative opioid taper instructions:   Complete by: As directed    POST-OPERATIVE OPIOID TAPER INSTRUCTIONS: It is important to wean off of your opioid medication as soon as possible. If you do not need pain medication after your surgery it is ok to stop day one. Opioids include: Codeine, Hydrocodone(Norco, Vicodin), Oxycodone(Percocet, oxycontin) and hydromorphone amongst others.  Long term and even short term use of opiods can cause: Increased pain response Dependence Constipation Depression Respiratory depression And more.  Withdrawal symptoms can include Flu like symptoms Nausea, vomiting And more Techniques to manage these symptoms Hydrate well Eat regular healthy meals Stay active Use relaxation techniques(deep breathing, meditating, yoga) Do Not substitute Alcohol to help with tapering If you have been on opioids for less than two weeks and do not have pain than it is ok to stop all together.  Plan to wean off of opioids This plan should start within one week post op of your joint replacement. Maintain the same interval or time between taking each dose and first decrease the dose.  Cut the total daily intake of opioids by one tablet each day Next start to increase the time between doses. The last dose that should be eliminated is the evening dose.      TED hose   Complete by: As directed    Use stockings (TED hose) for three weeks on both leg(s).  You may remove them at night for sleeping.   Weight bearing as tolerated   Complete by: As directed       Allergies as of 08/05/2023       Reactions   Codeine Nausea Only   Oxycodone  Nausea And Vomiting   Pt states she was "out of it"   Pravastatin Other (See Comments)   Other Reaction(s): Arthralgias   Progesterone Other (See Comments)   Other Reaction(s): severe hot flashes   Rosuvastatin Other (See Comments)   Other Reaction(s): Arthralgias   Tramadol Nausea Only        Medication List     STOP taking these medications    meloxicam 15 MG tablet Commonly known as: MOBIC       TAKE these medications    acetaminophen 500 MG tablet Commonly known as: TYLENOL Take 1,000 mg by mouth every 6 (six) hours as needed for moderate pain.   albuterol 108 (90 Base) MCG/ACT inhaler Commonly known as: VENTOLIN HFA Inhale 2 puffs into the lungs every 6 (six) hours as needed for wheezing or shortness of breath (allergies).   diazepam 5 MG tablet Commonly known as: VALIUM Take 5 mg by mouth every 6 (six) hours as needed for muscle spasms.   ESTER-C PO Take 1,000 mg by mouth daily.   fluticasone 50 MCG/ACT nasal spray Commonly known as: FLONASE Place  1 spray into both nostrils daily as needed for allergies.   gabapentin 300 MG capsule Commonly known as: NEURONTIN Take 1 capsule (300 mg total) by mouth as directed. Take a 300 mg capsule three times a day for two weeks following surgery.Then take a 300 mg capsule two times a day for two weeks. Then take a 300 mg capsule once a day for two weeks. Then discontinue.   HYDROmorphone 2 MG tablet Commonly known as: DILAUDID Take 1-2 tablets (2-4 mg total) by mouth every 6 (six) hours as needed for severe pain.   loratadine 10 MG tablet Commonly known as: CLARITIN Take 10 mg by mouth daily as needed for allergies.   Magnesium 250 MG Tabs Take 250 mg by mouth at bedtime.   methocarbamol 500 MG tablet Commonly known as: ROBAXIN Take 1 tablet (500 mg total) by mouth every 6 (six) hours as needed for muscle spasms.   nitroGLYCERIN 0.4 MG SL tablet Commonly known as: NITROSTAT Place 1 tablet (0.4 mg total) under  the tongue every 5 (five) minutes as needed for chest pain.   OMEGA-3 FISH OIL PO Take 1,000 mg by mouth daily after lunch.   ondansetron 4 MG tablet Commonly known as: ZOFRAN Take 1 tablet (4 mg total) by mouth every 6 (six) hours as needed for nausea.   pantoprazole 40 MG tablet Commonly known as: PROTONIX Take 40 mg by mouth daily as needed (Heartburn).   PRESCRIPTION MEDICATION Per pt injection every 6 months for cholesterol prescribed by Dr Waynard Edwards (PCP)   Procto-Med HC 2.5 % rectal cream Generic drug: hydrocortisone Place 1 Application rectally daily as needed for hemorrhoids or anal itching.   rivaroxaban 10 MG Tabs tablet Commonly known as: XARELTO Take 1 tablet (10 mg total) by mouth daily with breakfast for 19 days. Then take one 81 mg aspirin once a day for three weeks. Then discontinue aspirin.   triamcinolone cream 0.1 % Commonly known as: KENALOG Apply 1 Application topically as needed (Eczema).   Vitamin D3 125 MCG (5000 UT) Chew Chew 5,000 Units by mouth daily.   WOMENS MULTIVITAMIN PO Take 1 tablet by mouth daily after lunch.   Hair/Skin/Nails Caps Take 1 capsule by mouth daily after lunch.               Discharge Care Instructions  (From admission, onward)           Start     Ordered   08/05/23 0000  Weight bearing as tolerated        08/05/23 0808   08/05/23 0000  Change dressing       Comments: You may remove the bulky bandage (ACE wrap and gauze) two days after surgery. You will have an adhesive waterproof bandage underneath. Leave this in place until your first follow-up appointment.   08/05/23 0808            Follow-up Information     Ollen Gross, MD Follow up in 2 week(s).   Specialty: Orthopedic Surgery Contact information: [address] [address] [place] [address] [phone]                 Signed: R. Arcola Jansky, PA-C Orthopedic Surgery 08/06/2023, 8:02 AM

## 2023-09-02 DIAGNOSIS — E785 Hyperlipidemia, unspecified: Secondary | ICD-10-CM | POA: Insufficient documentation

## 2023-09-02 NOTE — Progress Notes (Unsigned)
Cardiology Office Note:   Date:  09/03/2023  ID:  Melanie Roth, DOB February 04, 1956, MRN [id number] PCP: Rodrigo Ran, MD  [facility] Providers Cardiologist:  Rollene Rotunda, MD {  History of Present Illness:   Melanie Roth is a 67 y.o. female who presents for evaluation of an elevated calcium score of 382.  This was 94th percentile.  Since I last saw her she had her left knee replaced. The patient denies any new symptoms such as chest discomfort, neck or arm discomfort. There has been no new shortness of breath, PND or orthopnea. There have been no reported palpitations, presyncope or syncope.   ROS: As stated in the HPI and negative for all other systems.  Studies Reviewed:    EKG:   NA  Risk Assessment/Calculations:              Physical Exam:   VS:  BP 118/78   Pulse 95   Ht 5' 1.5" (1.562 m)   Wt 193 lb 9.6 oz (87.8 kg)   SpO2 97%   BMI 35.99 kg/m    Wt Readings from Last 3 Encounters:  09/03/23 193 lb 9.6 oz (87.8 kg)  08/03/23 200 lb (90.7 kg)  07/23/23 200 lb (90.7 kg)     GEN: Well nourished, well developed in no acute distress NECK: No JVD; No carotid bruits CARDIAC: RRR, no murmurs, rubs, gallops RESPIRATORY:  Clear to auscultation without rales, wheezing or rhonchi  ABDOMEN: Soft, non-tender, non-distended EXTREMITIES:  No edema; No deformity   ASSESSMENT AND PLAN:   Elevated coronary calcium :    The patient has no symptoms.  We are participating in aggressive primary risk reduction.  She has had no symptoms despite going through surgery and rehab.  She will continue with medication management.   Dyslipidemia:   She is currently having treatment with inclisiran.  No change in therapy.  I will defer follow-up of her LDL to Rodrigo Ran, MD. the goal should be an LDL ideally in the 50s.          Follow up with me in 18 months.   Signed, Rollene Rotunda, MD

## 2023-09-03 ENCOUNTER — Ambulatory Visit: Payer: 59 | Attending: Cardiology | Admitting: Cardiology

## 2023-09-03 ENCOUNTER — Encounter: Payer: Self-pay | Admitting: Cardiology

## 2023-09-03 VITALS — BP 118/78 | HR 95 | Ht 61.5 in | Wt 193.6 lb

## 2023-09-03 DIAGNOSIS — E785 Hyperlipidemia, unspecified: Secondary | ICD-10-CM

## 2023-09-03 DIAGNOSIS — R931 Abnormal findings on diagnostic imaging of heart and coronary circulation: Secondary | ICD-10-CM

## 2023-09-03 MED ORDER — INCLISIRAN SODIUM 284 MG/1.5ML ~~LOC~~ SOSY
284.0000 mg | PREFILLED_SYRINGE | Freq: Once | SUBCUTANEOUS | Status: AC
Start: 2023-09-03 — End: ?

## 2023-09-03 NOTE — Patient Instructions (Signed)
    Follow-Up: At [facility], you and your health needs are our priority.  As part of our continuing mission to provide you with exceptional heart care, we have created designated Provider Care Teams.  These Care Teams include your primary Cardiologist (physician) and Advanced Practice Providers (APPs -  Physician Assistants and Nurse Practitioners) who all work together to provide you with the care you need, when you need it.  We recommend signing up for the patient portal called "MyChart".  Sign up information is provided on this After Visit Summary.  MyChart is used to connect with patients for Virtual Visits (Telemedicine).  Patients are able to view lab/test results, encounter notes, upcoming appointments, etc.  Non-urgent messages can be sent to your provider as well.   To learn more about what you can do with MyChart, go to [email or web address].    Your next appointment:   18 month(s)  Provider:   Rollene Rotunda, MD

## 2023-09-16 ENCOUNTER — Encounter (HOSPITAL_COMMUNITY)
Admission: RE | Admit: 2023-09-16 | Discharge: 2023-09-16 | Disposition: A | Discharge status: Home / Self Care | Payer: 59 | Source: Ambulatory Visit | Attending: Internal Medicine | Admitting: Internal Medicine

## 2023-09-16 ENCOUNTER — Other Ambulatory Visit (HOSPITAL_COMMUNITY): Payer: Self-pay | Admitting: *Deleted

## 2023-09-16 DIAGNOSIS — E785 Hyperlipidemia, unspecified: Secondary | ICD-10-CM | POA: Insufficient documentation

## 2023-09-16 MED ORDER — INCLISIRAN SODIUM 284 MG/1.5ML ~~LOC~~ SOSY
284.0000 mg | PREFILLED_SYRINGE | Freq: Once | SUBCUTANEOUS | Status: AC
  Administered 2023-09-16: 284 mg via SUBCUTANEOUS

## 2023-09-16 MED ORDER — INCLISIRAN SODIUM 284 MG/1.5ML ~~LOC~~ SOSY
PREFILLED_SYRINGE | SUBCUTANEOUS | Status: AC
  Filled 2023-09-16: qty 1.5

## 2023-09-17 ENCOUNTER — Inpatient Hospital Stay (HOSPITAL_COMMUNITY): Admission: RE | Admit: 2023-09-17 | Payer: 59 | Source: Ambulatory Visit

## 2023-10-20 IMAGING — CT CT CARDIAC CORONARY ARTERY CALCIUM SCORE
3 series · 14 of 20 positions shown, 16 images · non-contrast
Comparison: None.

EXAM:
CT CARDIAC CORONARY ARTERY CALCIUM SCORE
TECHNIQUE: Non-contrast imaging through the heart was performed using
prospective ECG gating. Image post processing was performed on an
independent workstation, allowing for quantitative analysis of the
heart and coronary arteries. Note that this exam targets the heart
and the chest was not imaged in its entirety.

[Series 2: calcium scoring 2.00 qr36 bestdiast 70% hrt calciu · axial · 0.37mm/px · z∈[+1612,+1690]mm · 4 of 66 slices shown]
[im 14/66  vessel]
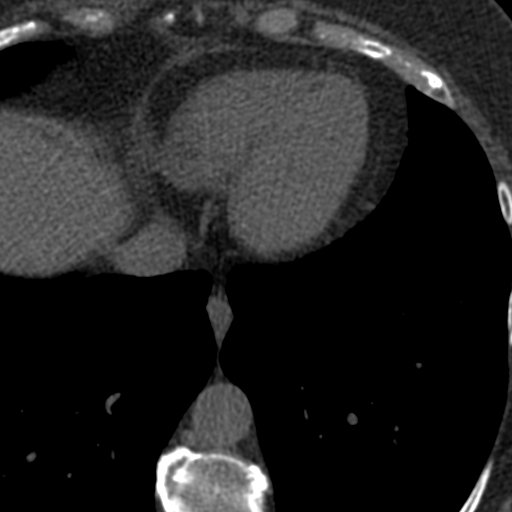
[im 27/66  vessel]
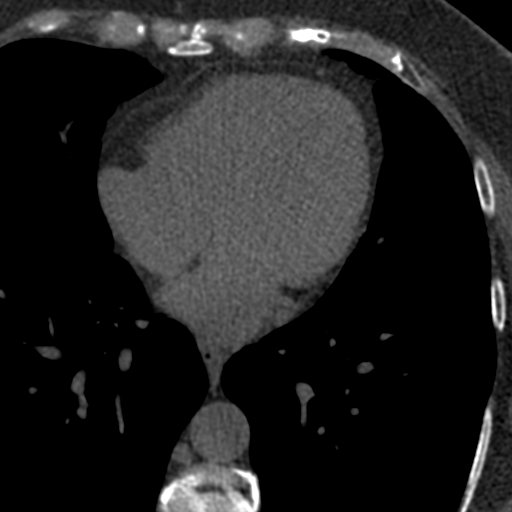
[im 40/66  vessel]
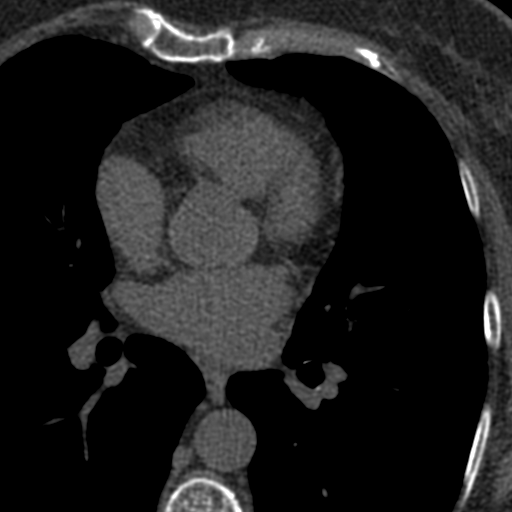
[im 53/66  vessel]
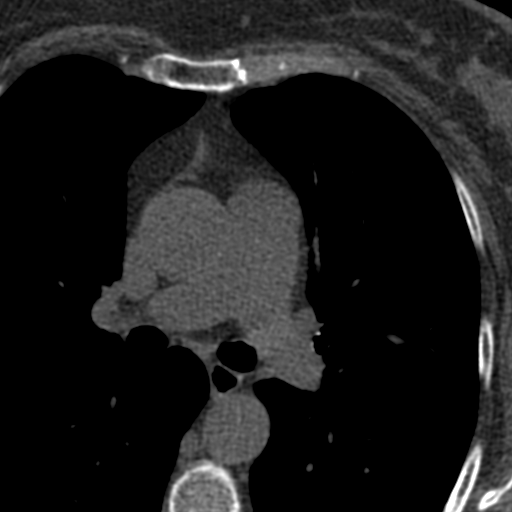

[Series 3: calcium scoring 2.00 br40 bestdiast 70% axial · axial · 0.63mm/px · z∈[+1600,+1692]mm · 5 of 70 slices shown, 7 images]
[im 12/70  vessel]
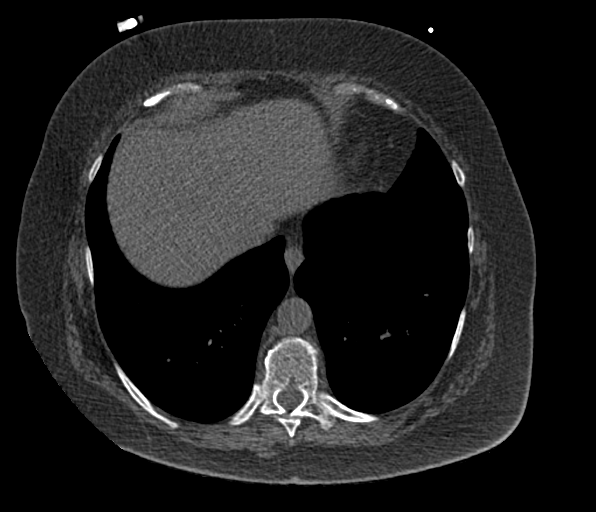
[im 12/70  lung]
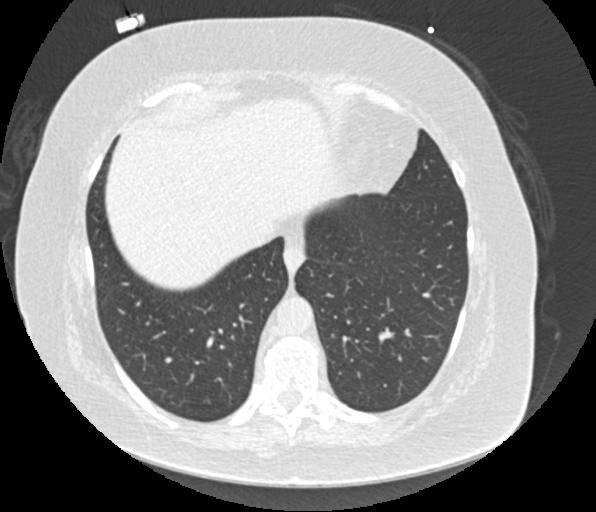
[im 24/70  vessel]
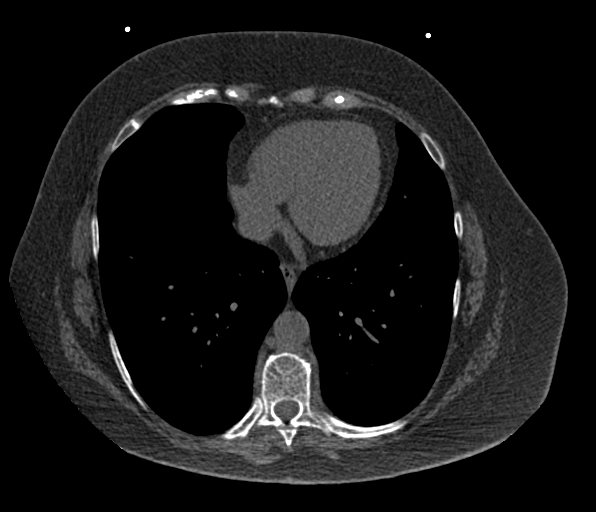
[im 35/70  vessel]
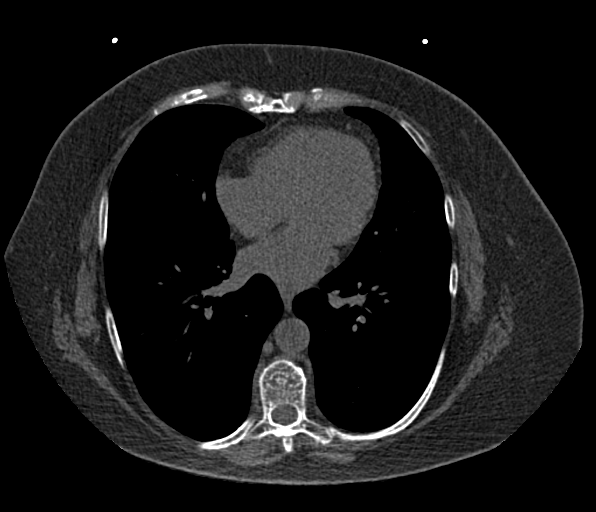
[im 47/70  vessel]
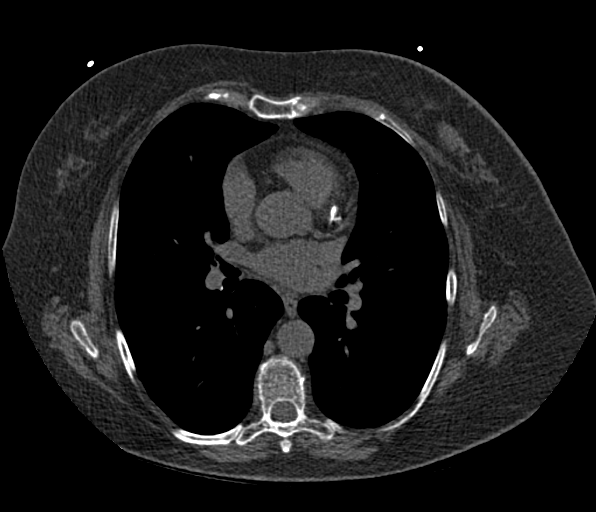
[im 58/70  vessel]
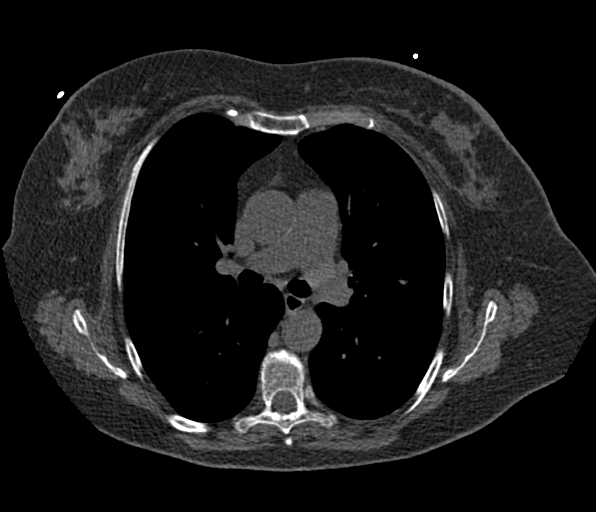
[im 58/70  lung]
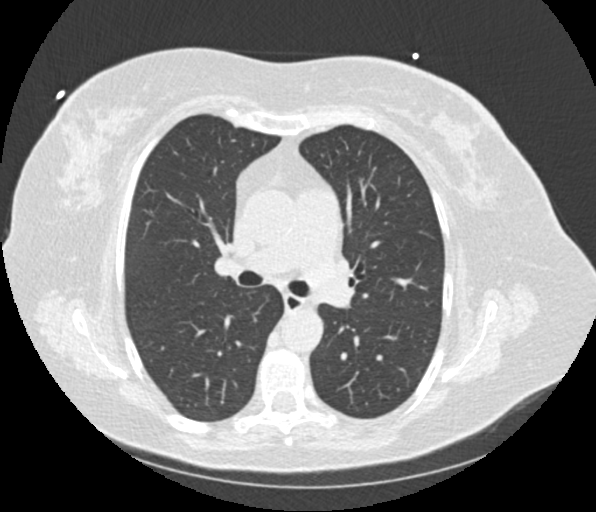

[Series 9: calcium scoring 2.00 br60 bestdiast 70% lungs · axial · 0.63mm/px · z∈[+1600,+1692]mm · 5 of 70 slices shown]
[im 12/70  vessel]
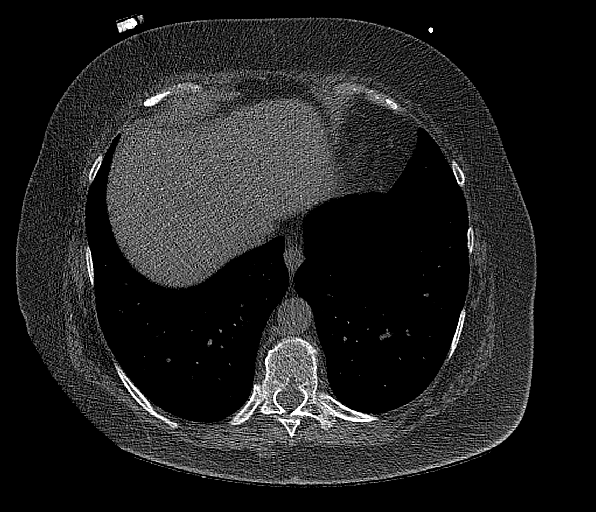
[im 24/70  vessel]
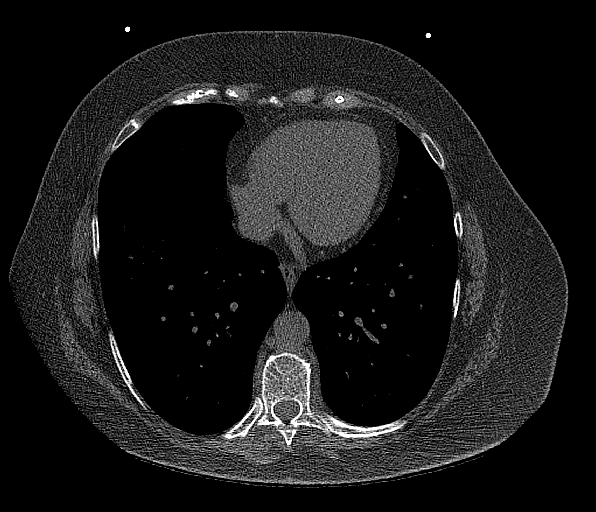
[im 35/70  vessel]
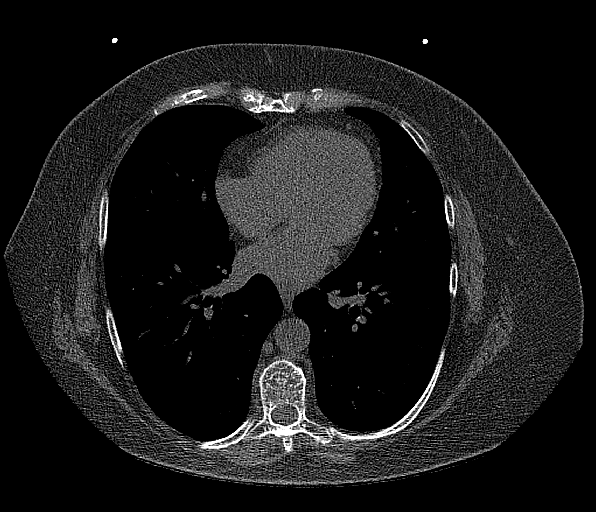
[im 47/70  vessel]
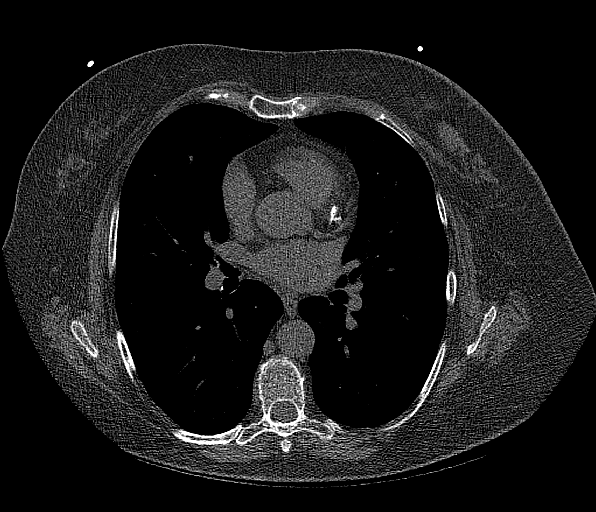
[im 58/70  vessel]
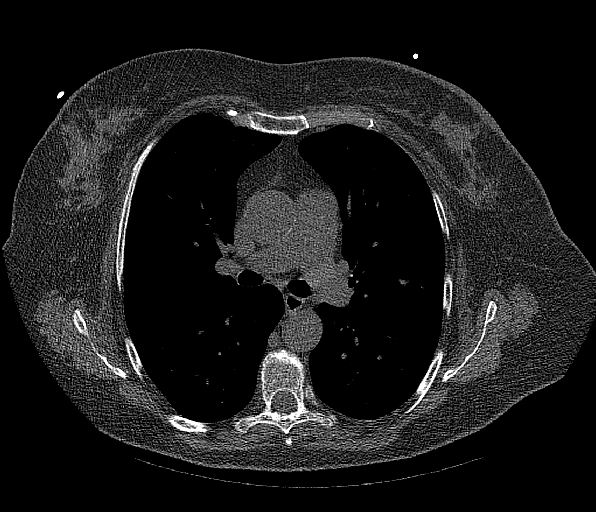

[14 of 20 positions shown; findings below may reference images not displayed]

FINDINGS: CORONARY CALCIUM SCORES:

Left Main: 0

LAD: 382

LCx: 0

RCA: 0

Total Agatston Score: 382

[HOSPITAL] percentile: 94

AORTA MEASUREMENTS:

Ascending Aorta: 31 mm

Descending Aorta: 24 mm

OTHER FINDINGS:

Heart is normal size. Aorta normal caliber. Scattered aortic
calcifications. No adenopathy. Lungs are clear. No focal airspace
opacities or suspicious nodules. No effusions. No acute findings in
the upper abdomen. Postsurgical changes in the left breast. No acute
bony abnormality.
IMPRESSION: Total Agatston score: 382

[HOSPITAL] percentile: 94

Scattered aortic calcifications.

No acute extra cardiac abnormality.

## 2024-01-22 ENCOUNTER — Other Ambulatory Visit: Payer: Self-pay | Admitting: Internal Medicine

## 2024-01-22 DIAGNOSIS — M51379 Other intervertebral disc degeneration, lumbosacral region without mention of lumbar back pain or lower extremity pain: Secondary | ICD-10-CM

## 2024-01-22 DIAGNOSIS — M545 Low back pain, unspecified: Secondary | ICD-10-CM

## 2024-01-24 ENCOUNTER — Ambulatory Visit
Admission: RE | Admit: 2024-01-24 | Discharge: 2024-01-24 | Disposition: A | Discharge status: Home / Self Care | Source: Ambulatory Visit | Attending: Internal Medicine | Admitting: Internal Medicine

## 2024-01-24 DIAGNOSIS — M545 Low back pain, unspecified: Secondary | ICD-10-CM

## 2024-01-24 DIAGNOSIS — M51379 Other intervertebral disc degeneration, lumbosacral region without mention of lumbar back pain or lower extremity pain: Secondary | ICD-10-CM

## 2024-03-16 ENCOUNTER — Encounter (HOSPITAL_COMMUNITY): Payer: 59

## 2024-03-23 ENCOUNTER — Other Ambulatory Visit (HOSPITAL_COMMUNITY): Payer: Self-pay | Admitting: *Deleted

## 2024-03-24 ENCOUNTER — Ambulatory Visit (HOSPITAL_COMMUNITY)
Admission: RE | Admit: 2024-03-24 | Discharge: 2024-03-24 | Disposition: A | Discharge status: Home / Self Care | Source: Ambulatory Visit | Attending: Internal Medicine | Admitting: Internal Medicine

## 2024-03-24 DIAGNOSIS — I2510 Atherosclerotic heart disease of native coronary artery without angina pectoris: Secondary | ICD-10-CM | POA: Diagnosis not present

## 2024-03-24 DIAGNOSIS — E785 Hyperlipidemia, unspecified: Secondary | ICD-10-CM | POA: Diagnosis present

## 2024-03-24 MED ORDER — INCLISIRAN SODIUM 284 MG/1.5ML ~~LOC~~ SOSY
PREFILLED_SYRINGE | SUBCUTANEOUS | Status: AC
  Filled 2024-03-24: qty 1.5

## 2024-03-24 MED ORDER — INCLISIRAN SODIUM 284 MG/1.5ML ~~LOC~~ SOSY
284.0000 mg | PREFILLED_SYRINGE | SUBCUTANEOUS | Status: DC
  Administered 2024-03-24: 284 mg via SUBCUTANEOUS

## 2024-05-31 ENCOUNTER — Telehealth (HOSPITAL_COMMUNITY): Payer: Self-pay | Admitting: Pharmacy Technician

## 2024-05-31 NOTE — Telephone Encounter (Signed)
 Auth Submission: PENDING Site of care: [facility] Payer: [employer] Medicare Medication & CPT/J Code(s) submitted: Leqvio  (Inclisiran) J1306 Diagnosis Code: E78.5 Route of submission (phone, fax, portal): portal Phone # Fax # Auth type: Buy/Bill HB Units/visits requested: 284mg  x 2 doses, q 6 mths Reference number: [id number]     Dagoberto Armour, CPhT [facility] Phone: [phone] 05/31/2024

## 2024-06-03 ENCOUNTER — Telehealth: Payer: Self-pay | Admitting: Cardiology

## 2024-06-03 DIAGNOSIS — I6521 Occlusion and stenosis of right carotid artery: Secondary | ICD-10-CM

## 2024-06-03 NOTE — Telephone Encounter (Signed)
 Pt went to Chiropractor today and he did an x-ray. He told her he was scared to do an adjustment because it looked like she had a blockage. Please advise

## 2024-06-03 NOTE — Telephone Encounter (Signed)
 Patient identification verified by 2 forms.   Called and spoke to patient  Patient states:  -Pt said chiropractor said he seen a blockage on her x/ray and was unable to do her adjustment today.  -Pt would like an ultrasound of her carotids  -Pt has a cd with the image on it.  -pt made aware we need the image, we can not access a CD.             Interventions/Plan: -Pt will reach out to provider's office to get a image.  -Will get message to Dr. Lavona for review.   Patient agrees with plan, no questions at this time

## 2024-06-06 NOTE — Telephone Encounter (Signed)
 Patient calling to follow up on her call from Friday.

## 2024-06-06 NOTE — Telephone Encounter (Signed)
 Spoke with the patient and advised on recommendations from Dr. Lavona. Carotid US  has been ordered.

## 2024-06-08 ENCOUNTER — Ambulatory Visit (HOSPITAL_COMMUNITY)
Admission: RE | Admit: 2024-06-08 | Discharge: 2024-06-08 | Disposition: A | Discharge status: Home / Self Care | Source: Ambulatory Visit | Attending: Cardiology | Admitting: Cardiology

## 2024-06-08 DIAGNOSIS — I6521 Occlusion and stenosis of right carotid artery: Secondary | ICD-10-CM | POA: Insufficient documentation

## 2024-06-10 ENCOUNTER — Telehealth: Payer: Self-pay | Admitting: Cardiology

## 2024-06-10 NOTE — Telephone Encounter (Signed)
 Pt is calling for f/u on test results. Please advise.

## 2024-06-10 NOTE — Telephone Encounter (Signed)
 Spoke with pt and notified her that the results of her carotid ultrasound are not yet available and that once the doctor has reviewed the test that we would give her a call. Pt verbalized understanding. All questions if any were answered.

## 2024-06-12 ENCOUNTER — Ambulatory Visit: Payer: Self-pay | Admitting: Cardiology

## 2024-06-13 NOTE — Telephone Encounter (Signed)
 Pt requesting a c/b in regards to results.

## 2024-06-13 NOTE — Telephone Encounter (Signed)
 See Results Follow Up from 8/3

## 2024-06-13 NOTE — Telephone Encounter (Signed)
 Spoke with pt and relayed her results. Pt stated her chiropractor refuses to do an adjustment with her having mild carotid stenosis and was hoping that Dr. Lavona could tell her whether it is safe or not. Pt also stated that her left foot is usually cold and her right foot is warm. Pt would like an explanation. Pt was told her questions would be forwarded to Dr. Lavona for his response. Pt verbalized understanding. All questions if any were available.

## 2024-07-07 ENCOUNTER — Other Ambulatory Visit: Payer: Self-pay | Admitting: Internal Medicine

## 2024-07-07 DIAGNOSIS — M5127 Other intervertebral disc displacement, lumbosacral region: Secondary | ICD-10-CM

## 2024-07-07 DIAGNOSIS — M545 Low back pain, unspecified: Secondary | ICD-10-CM

## 2024-07-22 NOTE — Discharge Instructions (Signed)

## 2024-07-25 ENCOUNTER — Other Ambulatory Visit (HOSPITAL_COMMUNITY): Payer: Self-pay | Admitting: Internal Medicine

## 2024-07-25 ENCOUNTER — Inpatient Hospital Stay
Admission: RE | Admit: 2024-07-25 | Discharge: 2024-07-25 | Disposition: A | Discharge status: Home / Self Care | Source: Ambulatory Visit | Attending: Internal Medicine | Admitting: Internal Medicine

## 2024-07-26 ENCOUNTER — Ambulatory Visit
Admission: RE | Admit: 2024-07-26 | Discharge: 2024-07-26 | Disposition: A | Discharge status: Home / Self Care | Source: Ambulatory Visit | Attending: Internal Medicine | Admitting: Internal Medicine

## 2024-07-26 DIAGNOSIS — M51379 Other intervertebral disc degeneration, lumbosacral region without mention of lumbar back pain or lower extremity pain: Secondary | ICD-10-CM

## 2024-07-26 DIAGNOSIS — M545 Low back pain, unspecified: Secondary | ICD-10-CM

## 2024-07-26 MED ORDER — IOPAMIDOL (ISOVUE-M 200) INJECTION 41%
1.0000 mL | Freq: Once | INTRAMUSCULAR | Status: AC
  Administered 2024-07-26: 1 mL via EPIDURAL

## 2024-07-26 MED ORDER — METHYLPREDNISOLONE ACETATE 40 MG/ML INJ SUSP (RADIOLOG: 80.0000 mg | Freq: Once | INTRAMUSCULAR | Status: DC

## 2024-07-26 NOTE — Discharge Instructions (Signed)

## 2024-07-27 ENCOUNTER — Other Ambulatory Visit: Payer: Self-pay | Admitting: Internal Medicine

## 2024-07-27 DIAGNOSIS — M545 Low back pain, unspecified: Secondary | ICD-10-CM

## 2024-09-26 ENCOUNTER — Inpatient Hospital Stay (HOSPITAL_COMMUNITY): Admission: RE | Admit: 2024-09-26 | Source: Ambulatory Visit

## 2024-10-03 ENCOUNTER — Ambulatory Visit (HOSPITAL_COMMUNITY)
Admission: RE | Admit: 2024-10-03 | Discharge: 2024-10-03 | Disposition: A | Discharge status: Home / Self Care | Source: Ambulatory Visit | Attending: Internal Medicine | Admitting: Internal Medicine

## 2024-10-03 VITALS — BP 107/75 | HR 87 | Temp 97.3°F | Resp 16

## 2024-10-03 DIAGNOSIS — E785 Hyperlipidemia, unspecified: Secondary | ICD-10-CM | POA: Diagnosis present

## 2024-10-03 MED ORDER — INCLISIRAN SODIUM 284 MG/1.5ML ~~LOC~~ SOSY
PREFILLED_SYRINGE | SUBCUTANEOUS | Status: AC
  Filled 2024-10-03: qty 1.5

## 2024-10-03 MED ORDER — INCLISIRAN SODIUM 284 MG/1.5ML ~~LOC~~ SOSY
284.0000 mg | PREFILLED_SYRINGE | Freq: Once | SUBCUTANEOUS | Status: AC
  Administered 2024-10-03: 284 mg via SUBCUTANEOUS

## 2024-10-25 NOTE — Progress Notes (Signed)
 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 Claryce Medical Oncologist: Dr. Siri Radiation Oncologist: Dr. Delores    Malignant neoplasm of central portion of left female breast    (CMD)  10/08/2021 Initial Diagnosis   Malignant neoplasm of central portion of left female breast (HCC)   10/15/2021 Surgery   Left lumpectomy and sentinel node biopsy   10/15/2021 -  Cancer Staged   Staging form: Breast, AJCC 8th Edition - Pathologic stage from 10/15/2021: Stage IIA (pT2, pN0(sn), cM0, G3, ER-, PR-, HER2-)   10/15/2021 -  Cancer Staged   Staging form: Breast, AJCC 8th Edition - Pathologic stage from 10/15/2021: Stage IIA (pT2, pN0(sn), cM0, G3, ER-, PR-, HER2-) - Signed by Interface, Edi Beacon on 11/14/2021    11/27/2021 - 12/17/2021 Radiation   EBRT 4005 cGy in 15 fractions to left whole breast   +++ Patient met with medical oncology team, Dr. Siri and refused chemotherapy or immunotherapy treatment.  HISTORY OF PRESENT ILLNESS:  The patient present to the breast care clinic for the first time 09/27/2021 for evaluation of breast cancer. She noted palpable left breast mass  a few weeks and sought care. Diagnostic mammogram and U/S on 08/2721 revealed a 3.3cm left breast mass at 12:00, 4 cm from the nipple, normal left axilla. A biopsy on 09/13/21 read as Left malignant spindle cell neoplasm, favor metaplastic, ER 0 PR 0 Her2 0 Ki67 8%. Breast [facility] on 09/23/21 revealed 3.5 cm (biopsy-proven) carcinoma in the left breast, an equivocal left axillary node measuring 2.2cm with an eccentric cortex, and no dominant enhancing lesion in the right breast.   Her past medical history includes hx of post-menopausal bleeding s/p D&C and uterine polypectomy on 01/02/2021 and osteoarthritis. She has a progesterone  eluting IUD.   Family history includes maternal aunt with ovarian cancer, second maternal aunt with cancer- primary unknown to pt; paternal aunt with breast cancer. Prior right breast cyst aspiration per her history.   She is overdue for a colonoscopy-has a history of polyps. She has problems with left knee arthritis and neck pain. History of benign thyroid  nodules. Otherwise she is healthy.She is not diabetic. She is not on blood thinners. She reports having a small airway with history of difficult intubations.  10/15/2021, left lumpectomy and sentinel lymph node biopsy, Dr. Claryce.  Final pathology showed metaplastic carcinoma of myofibroblastic origin measuring 3.4 cm in greatest dimension.  Grade 3.  Tumor involves anterior and inferior margins.  0/1 LN. pT2pN0.  ER/PR negative, HER2 negative.  10/30/21: They are now s/p left breast lumpectomy, sentinel lymph node mapping, and deep axillary sentinel lymph node biopsy on 10/15/2021. She does report she had a rash on her left arm for which she took a 4 day course of steroids to resolve it. She reports no pain, fever, weight loss, headaches, or any other constitutional symptoms. Tolerating regular diet w/o difficulty.  Patient received referral to radiation oncology.  04/30/22: s/p left breast lumpectomy, sentinel lymph node mapping, and deep axillary sentinel lymph node biopsy on 10/15/2021. She reports no pain, fever, weight loss, headaches, or any other constitutional symptoms. No new masses, nipple discharge, skin changes, bone pain, lymphadenopathy. RTC in 1 year, resume yearly diagnostic mammography bilaterally.  05/20/2023: today returns for scheduled follow-up. Mammogram from March is BI-RADS 2.  05/20/24: Currently, she complains of very little. They have appreciated no changes with breast self exams. She doesn't want additional mammogram today.   History of Present Illness 12/09/23: The patient presents for evaluation of palpable mass in left  breast lumpectomy incision, 1130, 1 cm from nipple.   05/27/24: Patient returns today after undergoing diagnostic mammogram and US . No complains of breast. Denies masses, skins changes, nipple discharge. Mammogram BIRADS 3 likely benign left breast asymmetry without US  correlate.   10/26/24: She returns today with complaints of mild stabbing pain that lasted about 2 days on the left side. Pt otherwise notes no changes to her health recently and has no other complaints at this time.     PHYSICAL EXAMINATION: VITAL SIGNS: There were no vitals taken for this visit.  Wt Readings from Last 3 Encounters:  05/27/24 88 kg (194 lb)  05/20/24 88.5 kg (195 lb)  12/09/23 86.5 kg (190 lb 11.2 oz)  HEENT: Atraumatic, normocephalic, extraocular muscles are intact NECK: Supple, negative for masses HEART: Normal rate, appears well perfused LUNGS:   Symmetric expansion of the chest, non labored breathing LYMPHATIC: No palpable bilateral axillary, cervical or supraclavicular lymph nodes. ABDOMEN: Soft, nontender, no hepatosplenomegaly. EXTREMITIES: Without cyanosis, clubbing or edema. NEUROLOGIC: Alert and oriented x 3. Motor is normal and gait is normal. Normal mood and normal affect CHEST: Breasts are asymmetric, B cup; Right: No focal masses, skin changes or dimpling, nipple discharge or retraction.  Left:  negative for skin changes or dimpling, nipple discharge -chronic inversion No open wounds or areas of concern for infection; no gross erythema, edema, bleeding, and/or drainage present.  ========================  IMAGING: Bilateral Diagnostic Mammogram/US  05/27/24 Narrative & Impression   BILATERAL DIAGNOSTIC MAMMOGRAM AND RIGHT BREAST ULTRASOUND  FINDINGS:  Mammography Right CC asymmetry in the inner aspect at middle third depth, which localizes to the lower aspect via tomosynthesis. Patient declined spot compression images.  Left No suspicious mass, calcification, or architectural  distortion. Lumpectomy changes.   Physical exam No corresponding discrete lump or skin changes.   Limited ultrasound Right 4:30, 5 cm from the nipple: Normal tissue is imaged in the area of mammographic concern.   IMPRESSION: CONCLUSION: 1.  Probably benign right breast CC asymmetry without a sonographic correlate. 2.  No evidence of left breast malignancy.   Breast composition: The breasts are heterogeneously dense, which may obscure small masses.   BI-RADS Category: 3 - Probably benign findings.     ======================== PATHOLOGY:  Final Pathologic Diagnosis  A. BREAST, LEFT, CENTRAL, LUMPECTOMY Metaplastic carcinoma of myofibroblastic origin (myofibroblastic carcinoma; malignant  myofibroblastoma. Tumor 3.4 cm grossly Tumor involves anterior and inferior margins; remaining margins free (see comment)  No lymphovascular or peri-neural invasion identified. Focal atypical intraductal hyperplasia. AJCC pathologic staging pT2 (sn)pN0 pMx; stage II.    B. SENTINEL LYMPH NODE #1, LEFT AXILLA, EXCISION:    1 lymph node negative for malignancy (0/1).   C. BREAST, LEFT INFERIOR/LATERAL MARGIN, RESECTION:  Atypical lobular hyperplasia (A5) (see comment) Proliferative fibrocystic changes consisting of usual ductal hyperplasia (UDH) and intraductal papillomatosis No evidence of in situ carcinoma or malignancy   D. BREAST, LEFT MEDIAL/SUPERIOR MARGIN, RESECTION Benign breast tissue with usual ductal hyperplasia (UDH) present.    E. BREAST, LEFT SUPERIOR/LATERAL MARGIN, RESECTION:    Benign breast tissue.   ======================== Final Pathologic Diagnosis- Bx A. BREAST, LEFT, 12:00, 4 CM FROM NIPPLE, ULTRASOUND GUIDED BIOPSY: Malignant spindle cell neoplasm, favor metaplastic carcinoma (see comment). Electronically signed by Toribio Keven Simple, MD  on 09/17/2021 Estrogen Receptor: Negative (0) Progesterone Receptor: Negative (0) Her2: Negative (0) Ki-67: Percentage of tumor  cells with nuclear positivity: 8 %  ASSESSMENT: Melanie Roth is a 68 y.o. female with metaplastic carcinoma of myofibroblastic origin s/p left breast lumpectomy, sentinel lymph node mapping, and deep axillary sentinel lymph node biopsy on 10/15/2021. 0/1 LN. Anterior and inferior margins positive, addressed with radiation therapy completed on 12/17/2021.  She met with medical oncology team including Dr. Siri and was adamant she did not want to pursue adjuvant chemotherapy or immunotherapy for triple negative breast cancer. Routine mammogram shows stability from prior scans. Will schedule 1 year follow up with repeat imaging.  -- Continue health maintenance with PCP, Oneil DELENA Neth, MD -- F/U in 1 year with MRI of the breast rather than mammography per patient request.  [place] DELENA. Claryce, M.D.  Professor of Surgery  Chief, Surgical Oncology Service  10/25/2024 5:26 PM

## 2025-03-27 ENCOUNTER — Encounter (HOSPITAL_COMMUNITY)

## 2025-04-04 ENCOUNTER — Encounter (HOSPITAL_COMMUNITY)
